# Patient Record
Sex: Female | Born: 1935 | Race: Black or African American | Hispanic: No | Smoking: Never smoker
Health system: Southern US, Community
[De-identification: ages and names within clinical notes are randomized; demographics above are authoritative.]

## PROBLEM LIST (undated history)

## (undated) DIAGNOSIS — R630 Anorexia: Secondary | ICD-10-CM

## (undated) DIAGNOSIS — E785 Hyperlipidemia, unspecified: Secondary | ICD-10-CM

## (undated) DIAGNOSIS — G250 Essential tremor: Secondary | ICD-10-CM

## (undated) DIAGNOSIS — K5090 Crohn's disease, unspecified, without complications: Secondary | ICD-10-CM

## (undated) DIAGNOSIS — G3109 Other frontotemporal dementia: Secondary | ICD-10-CM

## (undated) DIAGNOSIS — E559 Vitamin D deficiency, unspecified: Secondary | ICD-10-CM

## (undated) DIAGNOSIS — T783XXA Angioneurotic edema, initial encounter: Secondary | ICD-10-CM

## (undated) DIAGNOSIS — F0280 Dementia in other diseases classified elsewhere without behavioral disturbance: Secondary | ICD-10-CM

## (undated) DIAGNOSIS — E876 Hypokalemia: Secondary | ICD-10-CM

## (undated) DIAGNOSIS — I10 Essential (primary) hypertension: Secondary | ICD-10-CM

## (undated) DIAGNOSIS — N289 Disorder of kidney and ureter, unspecified: Secondary | ICD-10-CM

## (undated) DIAGNOSIS — D649 Anemia, unspecified: Secondary | ICD-10-CM

## (undated) DIAGNOSIS — T464X5A Adverse effect of angiotensin-converting-enzyme inhibitors, initial encounter: Secondary | ICD-10-CM

## (undated) DIAGNOSIS — M810 Age-related osteoporosis without current pathological fracture: Secondary | ICD-10-CM

## (undated) HISTORY — DX: Hyperlipidemia, unspecified: E78.5

## (undated) HISTORY — DX: Adverse effect of angiotensin-converting-enzyme inhibitors, initial encounter: T46.4X5A

## (undated) HISTORY — DX: Other frontotemporal dementia: G31.09

## (undated) HISTORY — DX: Essential (primary) hypertension: I10

## (undated) HISTORY — DX: Dementia in other diseases classified elsewhere, unspecified severity, without behavioral disturbance, psychotic disturbance, mood disturbance, and anxiety: F02.80

## (undated) HISTORY — DX: Vitamin D deficiency, unspecified: E55.9

## (undated) HISTORY — DX: Essential tremor: G25.0

## (undated) HISTORY — DX: Angioneurotic edema, initial encounter: T78.3XXA

## (undated) HISTORY — DX: Disorder of kidney and ureter, unspecified: N28.9

## (undated) HISTORY — DX: Anorexia: R63.0

## (undated) HISTORY — DX: Anemia, unspecified: D64.9

## (undated) HISTORY — DX: Crohn's disease, unspecified, without complications: K50.90

## (undated) HISTORY — DX: Hypokalemia: E87.6

## (undated) HISTORY — DX: Age-related osteoporosis without current pathological fracture: M81.0

---

## 2004-02-26 ENCOUNTER — Ambulatory Visit: Payer: Self-pay | Admitting: Gastroenterology

## 2006-06-15 DIAGNOSIS — I10 Essential (primary) hypertension: Secondary | ICD-10-CM

## 2006-06-15 DIAGNOSIS — E785 Hyperlipidemia, unspecified: Secondary | ICD-10-CM

## 2007-02-27 ENCOUNTER — Other Ambulatory Visit: Payer: Self-pay

## 2007-02-27 ENCOUNTER — Ambulatory Visit: Payer: Self-pay | Admitting: Ophthalmology

## 2007-03-04 ENCOUNTER — Ambulatory Visit: Payer: Self-pay | Admitting: Ophthalmology

## 2008-02-19 DIAGNOSIS — E2839 Other primary ovarian failure: Secondary | ICD-10-CM | POA: Insufficient documentation

## 2008-09-28 DIAGNOSIS — E559 Vitamin D deficiency, unspecified: Secondary | ICD-10-CM

## 2009-05-27 ENCOUNTER — Emergency Department: Payer: Self-pay

## 2009-06-28 DIAGNOSIS — F0280 Dementia in other diseases classified elsewhere without behavioral disturbance: Secondary | ICD-10-CM

## 2009-06-28 DIAGNOSIS — G3109 Other frontotemporal dementia: Secondary | ICD-10-CM

## 2009-09-07 HISTORY — PX: ABDOMINAL HYSTERECTOMY: SHX81

## 2013-03-11 ENCOUNTER — Ambulatory Visit: Payer: Self-pay | Admitting: Family Medicine

## 2013-03-11 LAB — HM DEXA SCAN: HM Dexa Scan: -3.9

## 2013-08-15 LAB — LIPID PANEL
Cholesterol: 223 mg/dL — AB (ref 0–200)
HDL: 66 mg/dL (ref 35–70)
LDL Cholesterol: 131 mg/dL
Triglycerides: 130 mg/dL (ref 40–160)

## 2014-06-09 ENCOUNTER — Other Ambulatory Visit: Payer: Self-pay | Admitting: Family Medicine

## 2014-06-19 ENCOUNTER — Other Ambulatory Visit: Payer: Self-pay | Admitting: Family Medicine

## 2014-06-19 NOTE — Telephone Encounter (Signed)
Patient requesting refill. 

## 2014-06-22 ENCOUNTER — Other Ambulatory Visit: Payer: Self-pay | Admitting: Family Medicine

## 2014-08-22 ENCOUNTER — Encounter: Payer: Self-pay | Admitting: Family Medicine

## 2014-08-22 DIAGNOSIS — T783XXA Angioneurotic edema, initial encounter: Secondary | ICD-10-CM

## 2014-08-22 DIAGNOSIS — K5010 Crohn's disease of large intestine without complications: Secondary | ICD-10-CM | POA: Insufficient documentation

## 2014-08-22 DIAGNOSIS — M810 Age-related osteoporosis without current pathological fracture: Secondary | ICD-10-CM | POA: Insufficient documentation

## 2014-08-22 DIAGNOSIS — Z862 Personal history of diseases of the blood and blood-forming organs and certain disorders involving the immune mechanism: Secondary | ICD-10-CM | POA: Insufficient documentation

## 2014-08-22 DIAGNOSIS — T464X5A Adverse effect of angiotensin-converting-enzyme inhibitors, initial encounter: Secondary | ICD-10-CM | POA: Insufficient documentation

## 2014-08-22 DIAGNOSIS — N183 Chronic kidney disease, stage 3 unspecified: Secondary | ICD-10-CM | POA: Insufficient documentation

## 2014-08-22 DIAGNOSIS — G250 Essential tremor: Secondary | ICD-10-CM | POA: Insufficient documentation

## 2014-08-25 ENCOUNTER — Encounter: Payer: Self-pay | Admitting: Family Medicine

## 2014-08-25 ENCOUNTER — Ambulatory Visit (INDEPENDENT_AMBULATORY_CARE_PROVIDER_SITE_OTHER): Payer: Medicare Other | Admitting: Family Medicine

## 2014-08-25 VITALS — BP 138/86 | HR 62 | Temp 97.8°F | Resp 16 | Ht 68.0 in | Wt 157.9 lb

## 2014-08-25 DIAGNOSIS — Z23 Encounter for immunization: Secondary | ICD-10-CM

## 2014-08-25 DIAGNOSIS — R630 Anorexia: Secondary | ICD-10-CM | POA: Insufficient documentation

## 2014-08-25 DIAGNOSIS — F0280 Dementia in other diseases classified elsewhere without behavioral disturbance: Secondary | ICD-10-CM

## 2014-08-25 DIAGNOSIS — I10 Essential (primary) hypertension: Secondary | ICD-10-CM | POA: Diagnosis not present

## 2014-08-25 DIAGNOSIS — E785 Hyperlipidemia, unspecified: Secondary | ICD-10-CM

## 2014-08-25 DIAGNOSIS — H9193 Unspecified hearing loss, bilateral: Secondary | ICD-10-CM | POA: Diagnosis not present

## 2014-08-25 DIAGNOSIS — G3109 Other frontotemporal dementia: Secondary | ICD-10-CM | POA: Diagnosis not present

## 2014-08-25 DIAGNOSIS — E559 Vitamin D deficiency, unspecified: Secondary | ICD-10-CM | POA: Diagnosis not present

## 2014-08-25 DIAGNOSIS — D509 Iron deficiency anemia, unspecified: Secondary | ICD-10-CM

## 2014-08-25 DIAGNOSIS — K5010 Crohn's disease of large intestine without complications: Secondary | ICD-10-CM

## 2014-08-25 DIAGNOSIS — N184 Chronic kidney disease, stage 4 (severe): Secondary | ICD-10-CM | POA: Diagnosis not present

## 2014-08-25 DIAGNOSIS — H9190 Unspecified hearing loss, unspecified ear: Secondary | ICD-10-CM | POA: Insufficient documentation

## 2014-08-25 MED ORDER — ATENOLOL-CHLORTHALIDONE 50-25 MG PO TABS: 0.5000 | ORAL_TABLET | Freq: Two times a day (BID) | ORAL | Status: DC

## 2014-08-25 MED ORDER — MIRTAZAPINE 15 MG PO TABS: 15.0000 mg | ORAL_TABLET | Freq: Every day | ORAL | Status: DC

## 2014-08-25 MED ORDER — ATORVASTATIN CALCIUM 40 MG PO TABS: 40.0000 mg | ORAL_TABLET | Freq: Every day | ORAL | Status: DC

## 2014-08-25 MED ORDER — DONEPEZIL HCL 5 MG PO TABS: 5.0000 mg | ORAL_TABLET | Freq: Every day | ORAL | Status: DC

## 2014-08-25 MED ORDER — AMLODIPINE BESYLATE 10 MG PO TABS: 10.0000 mg | ORAL_TABLET | Freq: Every day | ORAL | Status: DC

## 2014-08-25 NOTE — Progress Notes (Signed)
Name: Meredith Wagner   MRN: [id number]    DOB: Nov 17, 1917   Date:08/25/2014       Progress Note  Subjective  Chief Complaint  Chief Complaint  Patient presents with  . Medication Refill    6 month F/U  . Hypertension    Well Controlled  . Memory Loss    Doing stable on medication  . Hyperlipidemia    No problems with medication  . Weight Loss    On appetite, constantly eating snacks and cookies    HPI  HTN: doing well, bp is at goal, no dizziness, denies side effects of medication  Hyperlipidemia: taking Atorvastatin and denies myalgias  Frontotemporal dementia: she is stable, appetite is better with Remeron, lives with her daughter, normal behavior, cooperative, still able to do ADL on her own. Daughter dispenses her medication  CKI: time to recheck labs, denies pruritus, or edema.   Crohn's disease: doing well, going to see Dr. Hassell Done this Friday, no recent abdominal pain or rectal bleeding, taking medication as prescribed  Patient Active Problem List   Diagnosis Date Noted  . Lack of appetite 08/25/2014  . 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 Chronic kidney disease (CKD), stage IV (severe) 08/22/2014  . Crohn's disease of large bowel 08/22/2014  . Hereditary essential tremor 08/22/2014  . Anemia, iron deficiency 08/22/2014  . OP (osteoporosis) 08/22/2014  . Dementia, frontotemporal 06/28/2009  . Vitamin D deficiency 09/28/2008  . 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 Benign essential HTN 06/15/2006  . Dyslipidemia 06/15/2006    Past Surgical History  Procedure Laterality Date  . Abdominal hysterectomy  09/07/2009    History reviewed. No pertinent family history.  Social History   Social History  . Marital Status: Legally Separated    Spouse Name: N/A  . Number of Children: N/A  . Years of Education: N/A   Occupational History  . Not on file.   Social History Main Topics  . Smoking status: Never Smoker   . Smokeless tobacco: Never Used  .  Alcohol Use: No  . Drug Use: No  . Sexual Activity: Not Currently   Other Topics Concern  . Not on file   Social History Narrative     Current outpatient prescriptions:  .  amLODipine (NORVASC) 10 MG tablet, Take 1 tablet (10 mg total) by mouth daily., Disp: 90 tablet, Rfl: 1 .  atenolol-chlorthalidone (TENORETIC) 50-25 MG per tablet, Take 0.5 tablets by mouth 2 (two) times daily., Disp: 45 tablet, Rfl: 1 .  atorvastatin (LIPITOR) 40 MG tablet, Take 1 tablet (40 mg total) by mouth daily at 6 PM., Disp: 90 tablet, Rfl: 1 .  donepezil (ARICEPT) 5 MG tablet, Take 1 tablet (5 mg total) by mouth daily., Disp: 90 tablet, Rfl: 1 .  Mesalamine (ASACOL HD) 800 MG TBEC, Take 1 tablet by mouth 3 (three) times daily., Disp: , Rfl:  .  RA VITAMIN D-3 2000 UNITS CAPS, take 1 capsule by mouth once daily, Disp: 30 capsule, Rfl: 12 .  mirtazapine (REMERON) 15 MG tablet, Take 1 tablet (15 mg total) by mouth at bedtime., Disp: 90 tablet, Rfl: 1  Allergies  Allergen Reactions  . Ace Inhibitors     Other reaction(s): Angioedema  . Fluzone  [Flu Virus Vaccine]   . Niacin Er   . Penicillins      ROS  Constitutional: Negative for fever or weight change.  Respiratory: Negative for cough and shortness of breath.   Cardiovascular: Negative for chest pain or palpitations.  Gastrointestinal: Negative for abdominal pain, no bowel changes.  Musculoskeletal: Negative for gait problem or joint swelling.  Skin: Negative for rash.  Neurological: Negative for dizziness or headache.  No other specific complaints in a complete review of systems (except as listed in HPI above).  Objective  Filed Vitals:   08/25/14 0823  BP: 138/86  Pulse: 62  Temp: 97.8 F (36.6 C)  TempSrc: Oral  Resp: 16  Height: 5' 8"  (1.727 m)  Weight: 157 lb 14.4 oz (71.623 kg)  SpO2: 95%    Body mass index is 24.01 kg/(m^2).  Physical Exam  Constitutional: Patient appears well-developed and well-nourished.  No distress.   HEENT: head atraumatic, normocephalic, pupils equal and reactive to light,  neck supple, throat within normal limits Cardiovascular: Normal rate, regular rhythm and normal heart sounds.  No murmur heard. No BLE edema. Pulmonary/Chest: Effort normal and breath sounds normal. No respiratory distress. Abdominal: Soft.  There is no tenderness. Psychiatric: Patient has a normal mood and affect. behavior is normal. Neuro: hand tremors    PHQ2/9: Depression screen PHQ 2/9 08/25/2014  Decreased Interest 0  Down, Depressed, Hopeless 0  PHQ - 2 Score 0    Fall Risk: Fall Risk  08/25/2014  Falls in the past year? No      Assessment & Plan  1. Benign essential HTN At goal - Comprehensive metabolic panel - atenolol-chlorthalidone (TENORETIC) 50-25 MG per tablet; Take 0.5 tablets by mouth 2 (two) times daily.  Dispense: 45 tablet; Refill: 1 - amLODipine (NORVASC) 10 MG tablet; Take 1 tablet (10 mg total) by mouth daily.  Dispense: 90 tablet; Refill: 1  2. Chronic kidney disease (CKD), stage IV (severe)  - Comprehensive metabolic panel  3. Anemia, iron deficiency  - Ferritin - CBC with Differential/Platelet - Iron  4. Dyslipidemia  - Lipid panel - atorvastatin (LIPITOR) 40 MG tablet; Take 1 tablet (40 mg total) by mouth daily at 6 PM.  Dispense: 90 tablet; Refill: 1  5. Vitamin D deficiency  - Vit D  25 hydroxy (rtn osteoporosis monitoring)  6. Crohn's disease of large bowel, without complications  - Ferritin - CBC with Differential/Platelet - C-reactive protein  7. Needs flu shot  - Flu Vaccine QUAD 36+ mos IM  8. Dementia, frontotemporal  - donepezil (ARICEPT) 5 MG tablet; Take 1 tablet (5 mg total) by mouth daily.  Dispense: 90 tablet; Refill: 1  9. Lack of appetite  - mirtazapine (REMERON) 15 MG tablet; Take 1 tablet (15 mg total) by mouth at bedtime.  Dispense: 90 tablet; Refill: 1

## 2014-08-26 LAB — LIPID PANEL
CHOL/HDL RATIO: 2.9 ratio (ref 0.0–4.4)
CHOLESTEROL TOTAL: 172 mg/dL (ref 100–199)
HDL: 59 mg/dL (ref >39)
LDL CALC: 88 mg/dL (ref 0–99)
TRIGLYCERIDES: 124 mg/dL (ref 0–149)
VLDL CHOLESTEROL CAL: 25 mg/dL (ref 5–40)

## 2014-08-26 LAB — COMPREHENSIVE METABOLIC PANEL
ALBUMIN: 4 g/dL (ref 3.2–4.6)
ALK PHOS: 95 IU/L (ref 39–117)
ALT: 11 IU/L (ref 0–32)
AST: 15 IU/L (ref 0–40)
Albumin/Globulin Ratio: 1.2 (ref 1.1–2.5)
BUN / CREAT RATIO: 17 (ref 11–26)
BUN: 20 mg/dL (ref 10–36)
Bilirubin Total: 0.3 mg/dL (ref 0.0–1.2)
CO2: 26 mmol/L (ref 18–29)
CREATININE: 1.16 mg/dL — AB (ref 0.57–1.00)
Calcium: 9.4 mg/dL (ref 8.7–10.3)
Chloride: 103 mmol/L (ref 97–108)
GFR calc non Af Amer: 40 mL/min/{1.73_m2} — ABNORMAL LOW (ref >59)
GFR, EST AFRICAN AMERICAN: 47 mL/min/{1.73_m2} — AB (ref >59)
GLUCOSE: 101 mg/dL — AB (ref 65–99)
Globulin, Total: 3.3 g/dL (ref 1.5–4.5)
Potassium: 3.4 mmol/L — ABNORMAL LOW (ref 3.5–5.2)
Sodium: 145 mmol/L — ABNORMAL HIGH (ref 134–144)
TOTAL PROTEIN: 7.3 g/dL (ref 6.0–8.5)

## 2014-08-26 LAB — CBC WITH DIFFERENTIAL/PLATELET
Basophils Absolute: 0 10*3/uL (ref 0.0–0.2)
Basos: 1 %
EOS (ABSOLUTE): 0.2 10*3/uL (ref 0.0–0.4)
EOS: 2 %
HEMATOCRIT: 39.2 % (ref 34.0–46.6)
HEMOGLOBIN: 13.1 g/dL (ref 11.1–15.9)
IMMATURE GRANS (ABS): 0 10*3/uL (ref 0.0–0.1)
Immature Granulocytes: 0 %
LYMPHS ABS: 2.7 10*3/uL (ref 0.7–3.1)
LYMPHS: 33 %
MCH: 29.2 pg (ref 26.6–33.0)
MCHC: 33.4 g/dL (ref 31.5–35.7)
MCV: 88 fL (ref 79–97)
MONOCYTES: 7 %
Monocytes Absolute: 0.6 10*3/uL (ref 0.1–0.9)
Neutrophils Absolute: 4.8 10*3/uL (ref 1.4–7.0)
Neutrophils: 57 %
Platelets: 290 10*3/uL (ref 150–379)
RBC: 4.48 x10E6/uL (ref 3.77–5.28)
RDW: 14.1 % (ref 12.3–15.4)
WBC: 8.3 10*3/uL (ref 3.4–10.8)

## 2014-08-26 LAB — FERRITIN: FERRITIN: 167 ng/mL — AB (ref 15–150)

## 2014-08-26 LAB — IRON: Iron: 68 ug/dL (ref 27–139)

## 2014-08-26 LAB — C-REACTIVE PROTEIN: CRP: 5.6 mg/L — ABNORMAL HIGH (ref 0.0–4.9)

## 2014-08-26 LAB — VITAMIN D 25 HYDROXY (VIT D DEFICIENCY, FRACTURES): Vit D, 25-Hydroxy: 57.6 ng/mL (ref 30.0–100.0)

## 2014-08-26 NOTE — Progress Notes (Signed)
Patient daughter notified

## 2014-11-30 ENCOUNTER — Other Ambulatory Visit: Payer: Self-pay | Admitting: Family Medicine

## 2014-12-30 ENCOUNTER — Other Ambulatory Visit: Payer: Self-pay | Admitting: Family Medicine

## 2014-12-30 NOTE — Telephone Encounter (Signed)
Patient requesting refill. 

## 2015-01-29 ENCOUNTER — Other Ambulatory Visit: Payer: Self-pay | Admitting: Family Medicine

## 2015-01-29 NOTE — Telephone Encounter (Signed)
Patient requesting refill. 

## 2015-02-26 ENCOUNTER — Ambulatory Visit: Payer: Medicare Other | Admitting: Family Medicine

## 2015-02-28 ENCOUNTER — Other Ambulatory Visit: Payer: Self-pay | Admitting: Family Medicine

## 2015-03-01 NOTE — Telephone Encounter (Signed)
Patient has appointment for 03-02-15

## 2015-03-02 ENCOUNTER — Other Ambulatory Visit: Payer: Self-pay | Admitting: Family Medicine

## 2015-03-02 ENCOUNTER — Ambulatory Visit (INDEPENDENT_AMBULATORY_CARE_PROVIDER_SITE_OTHER): Payer: Medicare Other | Admitting: Family Medicine

## 2015-03-02 ENCOUNTER — Encounter: Payer: Self-pay | Admitting: Family Medicine

## 2015-03-02 VITALS — BP 128/82 | HR 68 | Temp 97.9°F | Resp 12 | Wt 156.1 lb

## 2015-03-02 DIAGNOSIS — N184 Chronic kidney disease, stage 4 (severe): Secondary | ICD-10-CM

## 2015-03-02 DIAGNOSIS — R630 Anorexia: Secondary | ICD-10-CM | POA: Diagnosis not present

## 2015-03-02 DIAGNOSIS — E785 Hyperlipidemia, unspecified: Secondary | ICD-10-CM | POA: Diagnosis not present

## 2015-03-02 DIAGNOSIS — I10 Essential (primary) hypertension: Secondary | ICD-10-CM | POA: Diagnosis not present

## 2015-03-02 DIAGNOSIS — G3109 Other frontotemporal dementia: Secondary | ICD-10-CM | POA: Diagnosis not present

## 2015-03-02 DIAGNOSIS — F0280 Dementia in other diseases classified elsewhere without behavioral disturbance: Secondary | ICD-10-CM | POA: Diagnosis not present

## 2015-03-02 DIAGNOSIS — K5010 Crohn's disease of large intestine without complications: Secondary | ICD-10-CM

## 2015-03-02 MED ORDER — MIRTAZAPINE 15 MG PO TABS: 15.0000 mg | ORAL_TABLET | Freq: Every day | ORAL | Status: DC

## 2015-03-02 NOTE — Progress Notes (Signed)
Name: Meredith Wagner   MRN: [id number]    DOB: 07-Mar-1917   Date:03/02/2015       Progress Note  Subjective  Chief Complaint  Chief Complaint  Patient presents with  . Medication Refill    4 month F/U  . Hypertension  . Hyperlipidemia  . Memory Loss    HPI  HTN: doing well, bp is at goal, no dizziness, no chest pain or palpitation, denies side effects of medication  Hyperlipidemia: taking Atorvastatin and denies myalgias, last labs at goal.   Frontotemporal dementia: she is stable, appetite is better with Remeron, lives with her daughter, normal behavior, cooperative, still able to do ADL on her own. Daughter dispenses her medication          CKI: time to recheck labs, denies pruritus, or edema.   Crohn's disease: doing well, going to see Dr. Hassell Done about 6 months, no recent abdominal pain or rectal bleeding, taking medication as prescribed   Patient Active Problem List   Diagnosis Date Noted  . Lack of appetite 08/25/2014  . Hearing loss 08/25/2014  . 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 Chronic kidney disease (CKD), stage IV (severe) ([place]) 08/22/2014  . Crohn's disease of large bowel ([place]) 08/22/2014  . Hereditary essential tremor 08/22/2014  . Anemia, iron deficiency 08/22/2014  . OP (osteoporosis) 08/22/2014  . Dementia, frontotemporal 06/28/2009  . Vitamin D deficiency 09/28/2008  . 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 Benign essential HTN 06/15/2006  . Dyslipidemia 06/15/2006    Past Surgical History  Procedure Laterality Date  . Abdominal hysterectomy  09/07/2009    History reviewed. No pertinent family history.  Social History   Social History  . Marital Status: Legally Separated    Spouse Name: N/A  . Number of Children: N/A  . Years of Education: N/A   Occupational History  . Not on file.   Social History Main Topics  . Smoking status: Never Smoker   . Smokeless tobacco: Never Used  . Alcohol Use: No  . Drug Use: No  . Sexual  Activity: Not Currently   Other Topics Concern  . Not on file   Social History Narrative     Current outpatient prescriptions:  .  amLODipine (NORVASC) 10 MG tablet, take 1 tablet by mouth once daily, Disp: 90 tablet, Rfl: 1 .  atenolol-chlorthalidone (TENORETIC) 50-25 MG tablet, take 1/2 tablet by mouth twice a day, Disp: 45 tablet, Rfl: 1 .  atorvastatin (LIPITOR) 40 MG tablet, take 1 tablet by mouth once daily AT 6 PM, Disp: 90 tablet, Rfl: 1 .  donepezil (ARICEPT) 5 MG tablet, take 1 tablet by mouth once daily, Disp: 90 tablet, Rfl: 1 .  Mesalamine (ASACOL HD) 800 MG TBEC, Take 1 tablet by mouth 3 (three) times daily., Disp: , Rfl:  .  mirtazapine (REMERON) 15 MG tablet, Take 1 tablet (15 mg total) by mouth at bedtime., Disp: 90 tablet, Rfl: 1 .  RA VITAMIN D-3 2000 UNITS CAPS, take 1 capsule by mouth once daily, Disp: 30 capsule, Rfl: 12  Allergies  Allergen Reactions  . 2,4-D Dimethylamine (Amisol) Swelling  . Ace Inhibitors     Other reaction(s): Angioedema  . Niacin Er   . Penicillins      ROS  Constitutional: Negative for fever or significant weight change.  Respiratory: Negative for cough and shortness of breath.   Cardiovascular: Negative for chest pain or palpitations.  Gastrointestinal: Negative for abdominal pain, no bowel changes.  Musculoskeletal: Negative for gait problem  or joint swelling.  Skin: Negative for rash.  Neurological: Negative for dizziness or headache.  No other specific complaints in a complete review of systems (except as listed in HPI above).  Objective  Filed Vitals:   03/02/15 1005  BP: 128/82  Pulse: 68  Temp: 97.9 F (36.6 C)  TempSrc: Oral  Resp: 12  Weight: 156 lb 1.6 oz (70.806 kg)  SpO2: 96%    Body mass index is 23.74 kg/(m^2).  Physical Exam  Constitutional: Patient appears well-developed and well-nourished. No distress.  HEENT: head atraumatic, normocephalic, pupils equal and reactive to light, neck supple, throat  within normal limits Cardiovascular: Normal rate, regular rhythm and normal heart sounds.  No murmur heard. No BLE edema. Pulmonary/Chest: Effort normal and breath sounds normal. No respiratory distress. Abdominal: Soft.  There is no tenderness. Psychiatric: Patient has a normal mood and affect. behavior is normal. J   PHQ2/9: Depression screen [facility] 2/9 03/02/2015 08/25/2014  Decreased Interest 0 0  Down, Depressed, Hopeless 0 0  PHQ - 2 Score 0 0    Fall Risk: Fall Risk  03/02/2015 08/25/2014  Falls in the past year? No No    Functional Status Survey: Is the patient deaf or have difficulty hearing?: Yes Does the patient have difficulty seeing, even when wearing glasses/contacts?: No Does the patient have difficulty concentrating, remembering, or making decisions?: No Does the patient have difficulty walking or climbing stairs?: Yes Does the patient have difficulty dressing or bathing?: No Does the patient have difficulty doing errands alone such as visiting a doctor's office or shopping?: No   Assessment & Plan  1. Benign essential HTN  Doing well, no side effects of medication.  2. Chronic kidney disease (CKD), stage IV (severe) (HCC)  -GFR  3. Dyslipidemia  Doing well on medication  4. Crohn's disease of large bowel, without complications  Doing well  5. Lack of appetite  - mirtazapine (REMERON) 15 MG tablet; Take 1 tablet (15 mg total) by mouth at bedtime.  Dispense: 90 tablet; Refill: 1  6. Dementia, frontotemporal  stable

## 2015-03-03 ENCOUNTER — Other Ambulatory Visit: Payer: Self-pay | Admitting: Family Medicine

## 2015-03-03 DIAGNOSIS — I10 Essential (primary) hypertension: Secondary | ICD-10-CM

## 2015-03-03 DIAGNOSIS — Z79899 Other long term (current) drug therapy: Secondary | ICD-10-CM

## 2015-03-03 DIAGNOSIS — N184 Chronic kidney disease, stage 4 (severe): Secondary | ICD-10-CM

## 2015-03-03 LAB — GLOM FILT RATE, ESTIMATED
Creatinine, Ser: 1.26 mg/dL — ABNORMAL HIGH (ref 0.57–1.00)
GFR, EST AFRICAN AMERICAN: 42 mL/min/{1.73_m2} — AB (ref >59)
GFR, EST NON AFRICAN AMERICAN: 36 mL/min/{1.73_m2} — AB (ref >59)

## 2015-03-30 ENCOUNTER — Other Ambulatory Visit: Payer: Self-pay | Admitting: Family Medicine

## 2015-04-10 ENCOUNTER — Other Ambulatory Visit: Payer: Self-pay | Admitting: Family Medicine

## 2015-04-12 NOTE — Telephone Encounter (Signed)
Patient requesting refill. 

## 2015-05-29 ENCOUNTER — Other Ambulatory Visit: Payer: Self-pay | Admitting: Family Medicine

## 2015-05-30 ENCOUNTER — Other Ambulatory Visit: Payer: Self-pay | Admitting: Family Medicine

## 2015-06-28 ENCOUNTER — Other Ambulatory Visit: Payer: Self-pay | Admitting: Family Medicine

## 2015-08-27 ENCOUNTER — Other Ambulatory Visit: Payer: Self-pay | Admitting: Family Medicine

## 2015-08-27 NOTE — Telephone Encounter (Signed)
Patient requesting refill of Amlodipine, Atorvastatin, Aricept be sent to [facility].

## 2015-08-30 ENCOUNTER — Ambulatory Visit (INDEPENDENT_AMBULATORY_CARE_PROVIDER_SITE_OTHER): Payer: Medicare Other | Admitting: Family Medicine

## 2015-08-30 ENCOUNTER — Ambulatory Visit: Payer: Medicare Other | Admitting: Family Medicine

## 2015-08-30 ENCOUNTER — Encounter: Payer: Self-pay | Admitting: Family Medicine

## 2015-08-30 VITALS — BP 132/84 | HR 63 | Temp 98.0°F | Resp 18 | Ht 68.0 in | Wt 150.4 lb

## 2015-08-30 DIAGNOSIS — F0280 Dementia in other diseases classified elsewhere without behavioral disturbance: Secondary | ICD-10-CM | POA: Diagnosis not present

## 2015-08-30 DIAGNOSIS — K5010 Crohn's disease of large intestine without complications: Secondary | ICD-10-CM

## 2015-08-30 DIAGNOSIS — H9193 Unspecified hearing loss, bilateral: Secondary | ICD-10-CM | POA: Diagnosis not present

## 2015-08-30 DIAGNOSIS — R630 Anorexia: Secondary | ICD-10-CM | POA: Diagnosis not present

## 2015-08-30 DIAGNOSIS — E785 Hyperlipidemia, unspecified: Secondary | ICD-10-CM

## 2015-08-30 DIAGNOSIS — I10 Essential (primary) hypertension: Secondary | ICD-10-CM | POA: Diagnosis not present

## 2015-08-30 DIAGNOSIS — E559 Vitamin D deficiency, unspecified: Secondary | ICD-10-CM | POA: Diagnosis not present

## 2015-08-30 DIAGNOSIS — G3109 Other frontotemporal dementia: Secondary | ICD-10-CM

## 2015-08-30 DIAGNOSIS — N184 Chronic kidney disease, stage 4 (severe): Secondary | ICD-10-CM

## 2015-08-30 LAB — CBC WITH DIFFERENTIAL/PLATELET
BASOS PCT: 1 %
Basophils Absolute: 101 cells/uL (ref 0–200)
EOS ABS: 202 {cells}/uL (ref 15–500)
Eosinophils Relative: 2 %
HEMATOCRIT: 39.9 % (ref 35.0–45.0)
Hemoglobin: 13.4 g/dL (ref 11.7–15.5)
LYMPHS PCT: 45 %
Lymphs Abs: 4545 cells/uL — ABNORMAL HIGH (ref 850–3900)
MCH: 29.5 pg (ref 27.0–33.0)
MCHC: 33.6 g/dL (ref 32.0–36.0)
MCV: 87.9 fL (ref 80.0–100.0)
MONO ABS: 909 {cells}/uL (ref 200–950)
MPV: 11.3 fL (ref 7.5–12.5)
Monocytes Relative: 9 %
NEUTROS ABS: 4343 {cells}/uL (ref 1500–7800)
Neutrophils Relative %: 43 %
PLATELETS: 316 10*3/uL (ref 140–400)
RBC: 4.54 MIL/uL (ref 3.80–5.10)
RDW: 14.4 % (ref 11.0–15.0)
WBC: 10.1 10*3/uL (ref 3.8–10.8)

## 2015-08-30 LAB — LIPID PANEL
CHOL/HDL RATIO: 2.6 ratio (ref <=5.0)
CHOLESTEROL: 182 mg/dL (ref 125–200)
HDL: 71 mg/dL (ref >=46)
LDL Cholesterol: 89 mg/dL (ref <130)
Triglycerides: 112 mg/dL (ref <150)
VLDL: 22 mg/dL (ref <30)

## 2015-08-30 LAB — COMPLETE METABOLIC PANEL WITH GFR
ALBUMIN: 4 g/dL (ref 3.6–5.1)
ALK PHOS: 92 U/L (ref 33–130)
ALT: 10 U/L (ref 6–29)
AST: 17 U/L (ref 10–35)
BILIRUBIN TOTAL: 0.5 mg/dL (ref 0.2–1.2)
BUN: 21 mg/dL (ref 7–25)
CALCIUM: 9.6 mg/dL (ref 8.6–10.4)
CO2: 25 mmol/L (ref 20–31)
CREATININE: 1.33 mg/dL — AB (ref 0.60–0.88)
Chloride: 104 mmol/L (ref 98–110)
GFR, Est African American: 39 mL/min — ABNORMAL LOW (ref >=60)
GFR, Est Non African American: 34 mL/min — ABNORMAL LOW (ref >=60)
GLUCOSE: 121 mg/dL — AB (ref 65–99)
POTASSIUM: 3.5 mmol/L (ref 3.5–5.3)
SODIUM: 143 mmol/L (ref 135–146)
TOTAL PROTEIN: 7.7 g/dL (ref 6.1–8.1)

## 2015-08-30 MED ORDER — DONEPEZIL HCL 10 MG PO TABS: 10.0000 mg | ORAL_TABLET | Freq: Every day | ORAL | 1 refills | Status: DC

## 2015-08-30 MED ORDER — MIRTAZAPINE 15 MG PO TABS: 15.0000 mg | ORAL_TABLET | Freq: Every day | ORAL | 1 refills | Status: DC

## 2015-08-30 NOTE — Progress Notes (Signed)
Name: Meredith Wagner   MRN: [id number]    DOB: [birth year]   Date:08/30/2015       Progress Note  Subjective  Chief Complaint  Chief Complaint  Patient presents with  . Medication Refill    6 month F/U  . Hypertension  . Hyperlipidemia  . Memory Loss    HPI  HTN: doing well, bp is at goal, no dizziness, no chest pain or palpitation, denies side effects of medication. BP is not getting checked at home  Hyperlipidemia: taking Atorvastatin and denies myalgias, last labs at goal. Due for repeat labs today   Frontotemporal dementia: she is stable, appetite is better with Remeron, but she has lost almost 6 lbs since last visit, lives with her daughter, normal behavior, cooperative, she is able to shower, eat, prepares breakfast. Daughter dispenses her medication and takes care of the house. Daughter feels like since she lost her grand-daughter she is feeling more sad, she was living with them for 6 months before she died.         CKI: time to recheck labs, denies pruritus, or edema. She has normal urine output  Crohn's disease: doing well, going to see Dr. Hassell Done every  6 months, no recent abdominal pain, diarrhea  or rectal bleeding, taking medication as prescribed - Asacol .   Patient Active Problem List   Diagnosis Date Noted  . Lack of appetite 08/25/2014  . Hearing loss 08/25/2014  . 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 Chronic kidney disease (CKD), stage IV (severe) ([place]) 08/22/2014  . Crohn's disease of large bowel ([place]) 08/22/2014  . Hereditary essential tremor 08/22/2014  . History of iron deficiency anemia 08/22/2014  . OP (osteoporosis) 08/22/2014  . Dementia, frontotemporal 06/28/2009  . Vitamin D deficiency 09/28/2008  . 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 Benign essential HTN 06/15/2006  . Dyslipidemia 06/15/2006    Past Surgical History:  Procedure Laterality Date  . ABDOMINAL HYSTERECTOMY  09/07/2009    No family history on file.  Social History    Social History  . Marital status: Legally Separated    Spouse name: N/A  . Number of children: N/A  . Years of education: N/A   Occupational History  . Not on file.   Social History Main Topics  . Smoking status: Never Smoker  . Smokeless tobacco: Never Used  . Alcohol use No  . Drug use: No  . Sexual activity: Not Currently   Other Topics Concern  . Not on file   Social History Narrative  . No narrative on file     Current Outpatient Prescriptions:  .  amLODipine (NORVASC) 10 MG tablet, take 1 tablet by mouth once daily, Disp: 90 tablet, Rfl: 1 .  atenolol-chlorthalidone (TENORETIC) 50-25 MG tablet, take 1/2 tablet by mouth twice a day, Disp: 45 tablet, Rfl: 1 .  atorvastatin (LIPITOR) 40 MG tablet, take 1 tablet by mouth once daily AT 6PM, Disp: 90 tablet, Rfl: 1 .  donepezil (ARICEPT) 5 MG tablet, take 1 tablet by mouth once daily, Disp: 90 tablet, Rfl: 1 .  Mesalamine (ASACOL HD) 800 MG TBEC, Take 1 tablet by mouth 3 (three) times daily., Disp: , Rfl:  .  mirtazapine (REMERON) 15 MG tablet, Take 1 tablet (15 mg total) by mouth at bedtime., Disp: 90 tablet, Rfl: 1 .  RA VITAMIN D-3 2000 units CAPS, take 1 capsule by mouth once daily, Disp: 30 capsule, Rfl: 12  Allergies  Allergen Reactions  . 2,4-D Dimethylamine (Amisol) Swelling  .  Ace Inhibitors     Other reaction(s): Angioedema  . Niacin Er   . Penicillins      ROS  Constitutional: Negative for fever, positive for  weight change.  Respiratory: Negative for cough and shortness of breath.   Cardiovascular: Negative for chest pain or palpitations.  Gastrointestinal: Negative for abdominal pain, no bowel changes.  Musculoskeletal: Negative for gait problem or joint swelling.  Skin: Negative for rash.  Neurological: Negative for dizziness or headache.  No other specific complaints in a complete review of systems (except as listed in HPI above).  Objective  Vitals:   08/30/15 0841  BP: 132/84  Pulse: 63   Resp: 18  Temp: 98 F (36.7 C)  TempSrc: Oral  SpO2: 97%  Weight: 150 lb 6.4 oz (68.2 kg)  Height: 5' 8"  (1.727 m)    Body mass index is 22.87 kg/m.  Physical Exam  Constitutional: Patient appears well-developed and well-nourished.  No distress.  HEENT: head atraumatic, normocephalic, pupils equal and reactive to light, neck supple, throat within normal limits Cardiovascular: Normal rate, regular rhythm and normal heart sounds.  No murmur heard. No BLE edema. Pulmonary/Chest: Effort normal and breath sounds normal. No respiratory distress. Abdominal: Soft.  There is no tenderness. Psychiatric: Patient has a normal mood and affect. behavior is normal. Judgment and thought content normal.  PHQ2/9: Depression screen [facility] 2/9 03/02/2015 08/25/2014  Decreased Interest 0 0  Down, Depressed, Hopeless 0 0  PHQ - 2 Score 0 0     Fall Risk: Fall Risk  03/02/2015 08/25/2014  Falls in the past year? No No     Assessment & Plan  1. Benign essential HTN  - COMPLETE METABOLIC PANEL WITH GFR - CBC with Differential/Platelet  2. Chronic kidney disease (CKD), stage IV (severe) (HCC)  - CBC with Differential/Platelet  3. Dyslipidemia  - Lipid panel  4. Crohn's disease of large bowel, without complications  - CBC with Differential/Platelet  5. Lack of appetite  - mirtazapine (REMERON) 15 MG tablet; Take 1 tablet (15 mg total) by mouth at bedtime.  Dispense: 90 tablet; Refill: 1  6. Dementia, frontotemporal  - donepezil (ARICEPT) 10 MG tablet; Take 1 tablet (10 mg total) by mouth daily.  Dispense: 90 tablet; Refill: 1  7. Vitamin D deficiency  - VITAMIN D 25 Hydroxy (Vit-D Deficiency, Fractures)  8. Hearing loss, bilateral  stable

## 2015-08-31 LAB — VITAMIN D 25 HYDROXY (VIT D DEFICIENCY, FRACTURES): Vit D, 25-Hydroxy: 72 ng/mL (ref 30–100)

## 2015-09-01 ENCOUNTER — Encounter: Payer: Self-pay | Admitting: Family Medicine

## 2015-09-02 ENCOUNTER — Other Ambulatory Visit: Payer: Self-pay | Admitting: Family Medicine

## 2015-09-02 ENCOUNTER — Other Ambulatory Visit: Payer: Self-pay

## 2015-09-02 DIAGNOSIS — R739 Hyperglycemia, unspecified: Secondary | ICD-10-CM

## 2015-09-03 LAB — HEMOGLOBIN A1C
Hgb A1c MFr Bld: 5.4 % (ref <5.7)
Mean Plasma Glucose: 108 mg/dL

## 2015-12-25 ENCOUNTER — Other Ambulatory Visit: Payer: Self-pay | Admitting: Family Medicine

## 2015-12-29 ENCOUNTER — Other Ambulatory Visit: Payer: Self-pay | Admitting: Family Medicine

## 2015-12-29 NOTE — Telephone Encounter (Signed)
Patient requesting refill of Atenolol-Chlorthalidone to [facility].

## 2016-01-05 ENCOUNTER — Telehealth: Payer: Self-pay | Admitting: Family Medicine

## 2016-01-05 NOTE — Telephone Encounter (Signed)
Patient has appointment scheduled for March for her 73monthfollow up. She only have 2 pills left of the atenolol. Please send to [facility]. Patient thought when she came last year that you gave her enough refills to last until her appointment but states you did not. Please call DBritt Boozer(daughter) [phone]

## 2016-01-06 ENCOUNTER — Other Ambulatory Visit: Payer: Self-pay | Admitting: Family Medicine

## 2016-01-06 MED ORDER — ATENOLOL-CHLORTHALIDONE 50-25 MG PO TABS: 0.5000 | ORAL_TABLET | Freq: Two times a day (BID) | ORAL | 1 refills | Status: DC

## 2016-01-06 NOTE — Telephone Encounter (Signed)
Sent prescription

## 2016-01-06 NOTE — Telephone Encounter (Signed)
Delores (daugher) informed.

## 2016-01-24 ENCOUNTER — Other Ambulatory Visit: Payer: Self-pay | Admitting: Family Medicine

## 2016-01-24 NOTE — Telephone Encounter (Signed)
Patient requesting refill of Amlodipine and Atorvastatin to [facility].

## 2016-02-21 ENCOUNTER — Other Ambulatory Visit: Payer: Self-pay | Admitting: Family Medicine

## 2016-03-02 ENCOUNTER — Encounter: Payer: Self-pay | Admitting: Family Medicine

## 2016-03-02 ENCOUNTER — Ambulatory Visit (INDEPENDENT_AMBULATORY_CARE_PROVIDER_SITE_OTHER): Payer: Medicare Other | Admitting: Family Medicine

## 2016-03-02 VITALS — BP 126/68 | HR 63 | Temp 97.7°F | Resp 16 | Ht 68.0 in | Wt 146.4 lb

## 2016-03-02 DIAGNOSIS — R630 Anorexia: Secondary | ICD-10-CM

## 2016-03-02 DIAGNOSIS — Z23 Encounter for immunization: Secondary | ICD-10-CM

## 2016-03-02 DIAGNOSIS — F0280 Dementia in other diseases classified elsewhere without behavioral disturbance: Secondary | ICD-10-CM

## 2016-03-02 DIAGNOSIS — I10 Essential (primary) hypertension: Secondary | ICD-10-CM | POA: Diagnosis not present

## 2016-03-02 DIAGNOSIS — E559 Vitamin D deficiency, unspecified: Secondary | ICD-10-CM

## 2016-03-02 DIAGNOSIS — G3109 Other frontotemporal dementia: Secondary | ICD-10-CM

## 2016-03-02 DIAGNOSIS — N183 Chronic kidney disease, stage 3 unspecified: Secondary | ICD-10-CM

## 2016-03-02 DIAGNOSIS — E785 Hyperlipidemia, unspecified: Secondary | ICD-10-CM

## 2016-03-02 LAB — BASIC METABOLIC PANEL WITH GFR
BUN: 24 mg/dL (ref 7–25)
CO2: 27 mmol/L (ref 20–31)
Calcium: 9.7 mg/dL (ref 8.6–10.4)
Chloride: 105 mmol/L (ref 98–110)
Creat: 1.44 mg/dL — ABNORMAL HIGH (ref 0.60–0.88)
GFR, EST NON AFRICAN AMERICAN: 31 mL/min — AB (ref >=60)
GFR, Est African American: 35 mL/min — ABNORMAL LOW (ref >=60)
GLUCOSE: 113 mg/dL — AB (ref 65–99)
POTASSIUM: 3.5 mmol/L (ref 3.5–5.3)
Sodium: 142 mmol/L (ref 135–146)

## 2016-03-02 MED ORDER — ATORVASTATIN CALCIUM 40 MG PO TABS: ORAL_TABLET | ORAL | 1 refills | Status: DC

## 2016-03-02 MED ORDER — MIRTAZAPINE 15 MG PO TABS: 15.0000 mg | ORAL_TABLET | Freq: Every day | ORAL | 1 refills | Status: DC

## 2016-03-02 MED ORDER — DONEPEZIL HCL 10 MG PO TABS: 10.0000 mg | ORAL_TABLET | Freq: Every day | ORAL | 1 refills | Status: DC

## 2016-03-02 MED ORDER — ATENOLOL-CHLORTHALIDONE 50-25 MG PO TABS: 0.5000 | ORAL_TABLET | Freq: Two times a day (BID) | ORAL | 1 refills | Status: DC

## 2016-03-02 NOTE — Progress Notes (Signed)
Name: Meredith Wagner   MRN: [id number]    DOB: Apr 28, 1917   Date:03/02/2016       Progress Note  Subjective  Chief Complaint  Chief Complaint  Patient presents with  . Medication Refill    6 month F/U  . Hypertension    Denies any symptoms  . Hyperlipidemia  . Chronic Kidney Disease    HPI  HTN: doing well, bp is at goal, no dizziness, no chest pain or palpitation, denies side effects of medication.  Hyperlipidemia: taking Atorvastatin and denies myalgias, last labs at goal.  Frontotemporal dementia: she is stable, appetite is stable  with Remeron, but she has lost almost 4 lbs since last visit, lives with her daughter, normal behavior, cooperative, she is able to shower, eat, prepares breakfast. Daughter dispenses her medication and takes care of the house. Daughter states she repeats herself now.   CKI: time to recheck labs, denies pruritus, or edema. She has normal urine output  Crohn's disease: doing well, sees GI  every  6 months, no recent abdominal pain, diarrhea  or rectal bleeding, taking medication as prescribed - Asacol .   Patient Active Problem List   Diagnosis Date Noted  . Lack of appetite 08/25/2014  . Hearing loss 08/25/2014  . 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 Chronic kidney disease, stage III (moderate) 08/22/2014  . Crohn's disease of large bowel ([place]) 08/22/2014  . Hereditary essential tremor 08/22/2014  . History of iron deficiency anemia 08/22/2014  . OP (osteoporosis) 08/22/2014  . Dementia, frontotemporal 06/28/2009  . Vitamin D deficiency 09/28/2008  . 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 Benign essential HTN 06/15/2006  . Dyslipidemia 06/15/2006    Past Surgical History:  Procedure Laterality Date  . ABDOMINAL HYSTERECTOMY  09/07/2009    History reviewed. No pertinent family history.  Social History   Social History  . Marital status: Legally Separated    Spouse name: N/A  . Number of children: N/A  . Years of education:  N/A   Occupational History  . Not on file.   Social History Main Topics  . Smoking status: Never Smoker  . Smokeless tobacco: Never Used  . Alcohol use No  . Drug use: No  . Sexual activity: Not Currently   Other Topics Concern  . Not on file   Social History Narrative   Lives with her daughter.         Current Outpatient Prescriptions:  .  amLODipine (NORVASC) 10 MG tablet, take 1 tablet by mouth once daily, Disp: 90 tablet, Rfl: 1 .  atenolol-chlorthalidone (TENORETIC) 50-25 MG tablet, Take 0.5 tablets by mouth 2 (two) times daily., Disp: 45 tablet, Rfl: 1 .  atorvastatin (LIPITOR) 40 MG tablet, take 1 tablet by mouth once daily AT 6 P.M., Disp: 90 tablet, Rfl: 1 .  donepezil (ARICEPT) 10 MG tablet, Take 1 tablet (10 mg total) by mouth daily., Disp: 90 tablet, Rfl: 1 .  Mesalamine 800 MG TBEC, Take 2 tablets by mouth 2 (two) times daily., Disp: , Rfl:  .  mirtazapine (REMERON) 15 MG tablet, Take 1 tablet (15 mg total) by mouth at bedtime., Disp: 90 tablet, Rfl: 1 .  RA VITAMIN D-3 2000 units CAPS, take 1 capsule by mouth once daily, Disp: 30 capsule, Rfl: 12  Allergies  Allergen Reactions  . 2,4-D Dimethylamine (Amisol) Swelling  . Ace Inhibitors     Other reaction(s): Angioedema  . Niacin Er   . Penicillins      ROS  Constitutional:  Negative for fever or weight change.  Respiratory: Negative for cough and shortness of breath.   Cardiovascular: Negative for chest pain or palpitations.  Gastrointestinal: Negative for abdominal pain, no bowel changes.  Musculoskeletal: negative for gait problem, no  joint swelling.  Skin: Negative for rash.  Neurological: Negative for dizziness or headache.  No other specific complaints in a complete review of systems (except as listed in HPI above).  Objective  Vitals:   03/02/16 0840  BP: 126/68  Pulse: 63  Resp: 16  Temp: 97.7 F (36.5 C)  TempSrc: Oral  SpO2: 91%  Weight: 146 lb 6.4 oz (66.4 kg)  Height: 5' 8"  (1.727  m)    Body mass index is 22.26 kg/m.  Physical Exam  Constitutional: Patient appears well-developed and well-nourished. Obese  No distress.  HEENT: head atraumatic, normocephalic, pupils equal and reactive to light,  neck supple, throat within normal limits. Difficulty hearing.  Cardiovascular: Normal rate, regular rhythm and normal heart sounds.  No murmur heard. No BLE edema. Pulmonary/Chest: Effort normal and breath sounds normal. No respiratory distress. Abdominal: Soft.  There is no tenderness. Psychiatric: Patient has a normal mood and affect. behavior is normal. Judgment and thought content normal.  PHQ2/9: Depression screen [facility] 2/9 03/02/2016 03/02/2015 08/25/2014  Decreased Interest 0 0 0  Down, Depressed, Hopeless 0 0 0  PHQ - 2 Score 0 0 0    Fall Risk: Fall Risk  03/02/2016 03/02/2015 08/25/2014  Falls in the past year? No No No     Functional Status Survey: Is the patient deaf or have difficulty hearing?: Yes Does the patient have difficulty seeing, even when wearing glasses/contacts?: No Does the patient have difficulty concentrating, remembering, or making decisions?: Yes Does the patient have difficulty walking or climbing stairs?: No Does the patient have difficulty dressing or bathing?: No Does the patient have difficulty doing errands alone such as visiting a doctor's office or shopping?: Yes    Assessment & Plan  1. Benign essential HTN  - atenolol-chlorthalidone (TENORETIC) 50-25 MG tablet; Take 0.5 tablets by mouth 2 (two) times daily.  Dispense: 45 tablet; Refill: 1  2. Dyslipidemia  - atorvastatin (LIPITOR) 40 MG tablet; take 1 tablet by mouth once daily AT 6 P.M.  Dispense: 90 tablet; Refill: 1  3. Dementia, frontotemporal  - donepezil (ARICEPT) 10 MG tablet; Take 1 tablet (10 mg total) by mouth daily.  Dispense: 90 tablet; Refill: 1  4. Vitamin D deficiency  Normal 6 months ago  5. Lack of appetite  - mirtazapine (REMERON) 15 MG tablet; Take  1 tablet (15 mg total) by mouth at bedtime.  Dispense: 90 tablet; Refill: 1  6. Chronic kidney disease, stage III (moderate)  - BASIC METABOLIC PANEL WITH GFR

## 2016-05-25 ENCOUNTER — Other Ambulatory Visit: Payer: Self-pay | Admitting: Family Medicine

## 2016-06-03 ENCOUNTER — Other Ambulatory Visit: Payer: Self-pay | Admitting: Family Medicine

## 2016-06-03 DIAGNOSIS — E559 Vitamin D deficiency, unspecified: Secondary | ICD-10-CM

## 2016-07-22 ENCOUNTER — Other Ambulatory Visit: Payer: Self-pay | Admitting: Family Medicine

## 2016-08-20 ENCOUNTER — Other Ambulatory Visit: Payer: Self-pay | Admitting: Family Medicine

## 2016-08-20 DIAGNOSIS — F0280 Dementia in other diseases classified elsewhere without behavioral disturbance: Secondary | ICD-10-CM

## 2016-08-20 DIAGNOSIS — G3109 Other frontotemporal dementia: Principal | ICD-10-CM

## 2016-09-05 ENCOUNTER — Encounter: Payer: Self-pay | Admitting: Family Medicine

## 2016-09-05 ENCOUNTER — Ambulatory Visit (INDEPENDENT_AMBULATORY_CARE_PROVIDER_SITE_OTHER): Payer: Medicare Other | Admitting: Family Medicine

## 2016-09-05 VITALS — BP 124/75 | HR 61 | Temp 97.8°F | Resp 16 | Ht 68.0 in | Wt 152.0 lb

## 2016-09-05 DIAGNOSIS — R630 Anorexia: Secondary | ICD-10-CM | POA: Diagnosis not present

## 2016-09-05 DIAGNOSIS — I10 Essential (primary) hypertension: Secondary | ICD-10-CM | POA: Diagnosis not present

## 2016-09-05 DIAGNOSIS — G250 Essential tremor: Secondary | ICD-10-CM

## 2016-09-05 DIAGNOSIS — K5010 Crohn's disease of large intestine without complications: Secondary | ICD-10-CM

## 2016-09-05 DIAGNOSIS — Z23 Encounter for immunization: Secondary | ICD-10-CM | POA: Diagnosis not present

## 2016-09-05 DIAGNOSIS — E559 Vitamin D deficiency, unspecified: Secondary | ICD-10-CM | POA: Diagnosis not present

## 2016-09-05 DIAGNOSIS — N184 Chronic kidney disease, stage 4 (severe): Secondary | ICD-10-CM

## 2016-09-05 DIAGNOSIS — E785 Hyperlipidemia, unspecified: Secondary | ICD-10-CM | POA: Diagnosis not present

## 2016-09-05 DIAGNOSIS — Z862 Personal history of diseases of the blood and blood-forming organs and certain disorders involving the immune mechanism: Secondary | ICD-10-CM

## 2016-09-05 LAB — CBC WITH DIFFERENTIAL/PLATELET
BASOS PCT: 1 %
Basophils Absolute: 108 cells/uL (ref 0–200)
EOS ABS: 108 {cells}/uL (ref 15–500)
Eosinophils Relative: 1 %
HEMATOCRIT: 40.3 % (ref 35.0–45.0)
Hemoglobin: 13.3 g/dL (ref 11.7–15.5)
Lymphocytes Relative: 33 %
Lymphs Abs: 3564 cells/uL (ref 850–3900)
MCH: 29.7 pg (ref 27.0–33.0)
MCHC: 33 g/dL (ref 32.0–36.0)
MCV: 90 fL (ref 80.0–100.0)
MONO ABS: 864 {cells}/uL (ref 200–950)
MPV: 10.4 fL (ref 7.5–12.5)
Monocytes Relative: 8 %
NEUTROS ABS: 6156 {cells}/uL (ref 1500–7800)
Neutrophils Relative %: 57 %
PLATELETS: 293 10*3/uL (ref 140–400)
RBC: 4.48 MIL/uL (ref 3.80–5.10)
RDW: 14.3 % (ref 11.0–15.0)
WBC: 10.8 10*3/uL (ref 3.8–10.8)

## 2016-09-05 MED ORDER — MIRTAZAPINE 15 MG PO TABS: 15.0000 mg | ORAL_TABLET | Freq: Every day | ORAL | 1 refills | Status: DC

## 2016-09-05 MED ORDER — ATENOLOL-CHLORTHALIDONE 50-25 MG PO TABS: 0.5000 | ORAL_TABLET | Freq: Two times a day (BID) | ORAL | 1 refills | Status: DC

## 2016-09-05 MED ORDER — ATORVASTATIN CALCIUM 40 MG PO TABS: ORAL_TABLET | ORAL | 1 refills | Status: DC

## 2016-09-05 NOTE — Progress Notes (Signed)
Name: Meredith Wagner   MRN: [id number]    DOB: [birth year]   Date:09/05/2016       Progress Note  Subjective  Chief Complaint  Chief Complaint  Patient presents with  . Follow-up    6 mo  . Hyperlipidemia  . Crohn's Disease    HPI  HTN: doing well, bp is at goal, no dizziness, no chest pain or palpitation, denies side effects of medication. EKG is in the chart.  Hyperlipidemia: taking Atorvastatin and denies myalgias, we will recheck labs  Frontotemporal dementia: she is stable, appetite is stable  with Remeron,she gained 6 lbs since last visit, lives with her daughter, normal behavior, cooperative, she is able to shower, eat, prepares breakfast. Daughter dispenses her medication and takes care of the house.    CKI: time to recheck labs, denies pruritus, or edema. She has normal urine output, she does not want to see nephrologist   Crohn's disease: doing well, sees GI  every 6 months, no recent abdominal pain, diarrhea or rectal bleeding, taking medication as prescribed - Asacol . We will check CRP   Patient Active Problem List   Diagnosis Date Noted  . Chronic kidney disease (CKD), stage IV (severe) ([place]) 09/05/2016  . Hearing loss 08/25/2014  . 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 Crohn's disease of large bowel ([place]) 08/22/2014  . Hereditary essential tremor 08/22/2014  . History of iron deficiency anemia 08/22/2014  . OP (osteoporosis) 08/22/2014  . Dementia, frontotemporal 06/28/2009  . Vitamin D deficiency 09/28/2008  . 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 Benign essential HTN 06/15/2006  . Dyslipidemia 06/15/2006    Past Surgical History:  Procedure Laterality Date  . ABDOMINAL HYSTERECTOMY  09/07/2009    History reviewed. No pertinent family history.  Social History   Social History  . Marital status: Legally Separated    Spouse name: N/A  . Number of children: N/A  . Years of education: N/A   Occupational History  . Not on file.   Social  History Main Topics  . Smoking status: Never Smoker  . Smokeless tobacco: Never Used  . Alcohol use No  . Drug use: No  . Sexual activity: Not Currently   Other Topics Concern  . Not on file   Social History Narrative   Lives with her daughter.         Current Outpatient Prescriptions:  .  amLODipine (NORVASC) 10 MG tablet, take 1 tablet by mouth once daily, Disp: 90 tablet, Rfl: 1 .  atenolol-chlorthalidone (TENORETIC) 50-25 MG tablet, Take 0.5 tablets by mouth 2 (two) times daily., Disp: 45 tablet, Rfl: 1 .  atorvastatin (LIPITOR) 40 MG tablet, take 1 tablet by mouth once daily AT 6 P.M., Disp: 90 tablet, Rfl: 1 .  donepezil (ARICEPT) 10 MG tablet, take 1 tablet by mouth once daily, Disp: 90 tablet, Rfl: 1 .  Mesalamine 800 MG TBEC, Take 2 tablets by mouth 2 (two) times daily., Disp: , Rfl:  .  mirtazapine (REMERON) 15 MG tablet, Take 1 tablet (15 mg total) by mouth at bedtime., Disp: 90 tablet, Rfl: 1 .  RA VITAMIN D-3 2000 units CAPS, take 1 capsule by mouth once daily, Disp: 30 capsule, Rfl: 12  Allergies  Allergen Reactions  . 2,4-D Dimethylamine (Amisol) Swelling  . Ace Inhibitors     Other reaction(s): Angioedema  . Niacin Er   . Penicillins      ROS  Constitutional: Negative for fever, positive for  weight change.  Respiratory: Negative for  cough and shortness of breath.   Cardiovascular: Negative for chest pain or palpitations.  Gastrointestinal: Negative for abdominal pain, no bowel changes.  Musculoskeletal: Negative for gait problem or joint swelling.  Skin: Negative for rash.  Neurological: Negative for dizziness or headache.  No other specific complaints in a complete review of systems (except as listed in HPI above).  Objective  Vitals:   09/05/16 0802  BP: 124/75  Pulse: 61  Resp: 16  Temp: 97.8 F (36.6 C)  TempSrc: Oral  SpO2: 97%  Weight: 152 lb (68.9 kg)  Height: 5' 8"  (1.727 m)    Body mass index is 23.11 kg/m.  Physical  Exam  Constitutional: Patient appears well-developed and well-nourished.No distress.  HEENT: head atraumatic, normocephalic, pupils equal and reactive to light, neck supple, throat within normal limits Cardiovascular: Normal rate, regular rhythm and normal heart sounds.  No murmur heard. No BLE edema. Pulmonary/Chest: Effort normal and breath sounds normal. No respiratory distress. Abdominal: Soft.  There is no tenderness. Psychiatric: Patient has a normal mood and affect. behavior is normal. Judgment and thought content normal.  PHQ2/9: Depression screen [facility] 2/9 09/05/2016 03/02/2016 03/02/2015 08/25/2014  Decreased Interest 0 0 0 0  Down, Depressed, Hopeless 0 0 0 0  PHQ - 2 Score 0 0 0 0     Fall Risk: Fall Risk  09/05/2016 03/02/2016 03/02/2015 08/25/2014  Falls in the past year? No No No No    Functional Status Survey: Is the patient deaf or have difficulty hearing?: Yes Does the patient have difficulty seeing, even when wearing glasses/contacts?: Yes (glasses) Does the patient have difficulty concentrating, remembering, or making decisions?: Yes Does the patient have difficulty walking or climbing stairs?: No Does the patient have difficulty dressing or bathing?: No Does the patient have difficulty doing errands alone such as visiting a doctor's office or shopping?: Yes    Assessment & Plan  1. Chronic kidney disease (CKD), stage IV (severe) (HCC)  Daughter and patient do not want referral to nephrologist  - CBC with Differential/Platelet - COMPLETE METABOLIC PANEL WITH GFR - Parathyroid hormone, intact (no Ca) - VITAMIN D 25 Hydroxy (Vit-D Deficiency, Fractures)  2. Crohn's disease of large intestine without complication (HCC)  - C-reactive protein  3. History of iron deficiency anemia  Recheck labs  4. Benign essential HTN  At goal, denies dizziness - atenolol-chlorthalidone (TENORETIC) 50-25 MG tablet; Take 0.5 tablets by mouth 2 (two) times daily.  Dispense: 45  tablet; Refill: 1  5. Dyslipidemia  - Lipid panel - atorvastatin (LIPITOR) 40 MG tablet; take 1 tablet by mouth once daily AT 6 P.M.  Dispense: 90 tablet; Refill: 1  6. Hereditary essential tremor  stable  7. Vitamin D deficiency  - VITAMIN D 25 Hydroxy (Vit-D Deficiency, Fractures)  8. Lack of appetite  Doing well, continue medication  - mirtazapine (REMERON) 15 MG tablet; Take 1 tablet (15 mg total) by mouth at bedtime.  Dispense: 90 tablet; Refill: 1  9. Needs flu shot  - Flu vaccine HIGH DOSE PF

## 2016-09-06 LAB — COMPLETE METABOLIC PANEL WITH GFR
ALT: 10 U/L (ref 6–29)
AST: 16 U/L (ref 10–35)
Albumin: 4.1 g/dL (ref 3.6–5.1)
Alkaline Phosphatase: 101 U/L (ref 33–130)
BILIRUBIN TOTAL: 0.5 mg/dL (ref 0.2–1.2)
BUN: 24 mg/dL (ref 7–25)
CO2: 24 mmol/L (ref 20–32)
Calcium: 9.5 mg/dL (ref 8.6–10.4)
Chloride: 103 mmol/L (ref 98–110)
Creat: 1.32 mg/dL — ABNORMAL HIGH (ref 0.60–0.88)
GFR, EST AFRICAN AMERICAN: 39 mL/min — AB (ref >=60)
GFR, Est Non African American: 34 mL/min — ABNORMAL LOW (ref >=60)
Glucose, Bld: 114 mg/dL — ABNORMAL HIGH (ref 65–99)
Potassium: 3.5 mmol/L (ref 3.5–5.3)
Sodium: 142 mmol/L (ref 135–146)
TOTAL PROTEIN: 7.5 g/dL (ref 6.1–8.1)

## 2016-09-06 LAB — LIPID PANEL
CHOL/HDL RATIO: 2.4 ratio (ref <5.0)
CHOLESTEROL: 178 mg/dL (ref <200)
HDL: 74 mg/dL (ref >50)
LDL CALC: 86 mg/dL (ref <100)
Triglycerides: 91 mg/dL (ref <150)
VLDL: 18 mg/dL (ref <30)

## 2016-09-06 LAB — C-REACTIVE PROTEIN: CRP: 26.3 mg/L — ABNORMAL HIGH (ref <8.0)

## 2016-09-06 LAB — VITAMIN D 25 HYDROXY (VIT D DEFICIENCY, FRACTURES): VIT D 25 HYDROXY: 65 ng/mL (ref 30–100)

## 2016-09-06 LAB — PARATHYROID HORMONE, INTACT (NO CA): PTH: 83 pg/mL — ABNORMAL HIGH (ref 14–64)

## 2016-09-07 ENCOUNTER — Encounter: Payer: Self-pay | Admitting: Family Medicine

## 2016-09-07 DIAGNOSIS — R739 Hyperglycemia, unspecified: Secondary | ICD-10-CM | POA: Insufficient documentation

## 2016-11-17 ENCOUNTER — Other Ambulatory Visit: Payer: Self-pay | Admitting: Family Medicine

## 2016-11-17 DIAGNOSIS — I10 Essential (primary) hypertension: Secondary | ICD-10-CM

## 2017-02-18 ENCOUNTER — Other Ambulatory Visit: Payer: Self-pay | Admitting: Family Medicine

## 2017-02-18 DIAGNOSIS — G3109 Other frontotemporal dementia: Principal | ICD-10-CM

## 2017-02-18 DIAGNOSIS — F0280 Dementia in other diseases classified elsewhere without behavioral disturbance: Secondary | ICD-10-CM

## 2017-03-12 ENCOUNTER — Ambulatory Visit: Payer: Medicare Other | Admitting: Family Medicine

## 2017-03-12 ENCOUNTER — Encounter: Payer: Self-pay | Admitting: Family Medicine

## 2017-03-12 ENCOUNTER — Ambulatory Visit: Payer: Medicare Other

## 2017-03-12 ENCOUNTER — Ambulatory Visit (INDEPENDENT_AMBULATORY_CARE_PROVIDER_SITE_OTHER): Payer: Medicare Other | Admitting: Family Medicine

## 2017-03-12 VITALS — BP 124/60 | HR 65 | Resp 16 | Ht 68.0 in | Wt 148.6 lb

## 2017-03-12 DIAGNOSIS — R630 Anorexia: Secondary | ICD-10-CM | POA: Diagnosis not present

## 2017-03-12 DIAGNOSIS — R7982 Elevated C-reactive protein (CRP): Secondary | ICD-10-CM

## 2017-03-12 DIAGNOSIS — G3109 Other frontotemporal dementia: Secondary | ICD-10-CM | POA: Diagnosis not present

## 2017-03-12 DIAGNOSIS — F0280 Dementia in other diseases classified elsewhere without behavioral disturbance: Secondary | ICD-10-CM

## 2017-03-12 DIAGNOSIS — I10 Essential (primary) hypertension: Secondary | ICD-10-CM

## 2017-03-12 DIAGNOSIS — G250 Essential tremor: Secondary | ICD-10-CM | POA: Diagnosis not present

## 2017-03-12 DIAGNOSIS — Z862 Personal history of diseases of the blood and blood-forming organs and certain disorders involving the immune mechanism: Secondary | ICD-10-CM

## 2017-03-12 DIAGNOSIS — N184 Chronic kidney disease, stage 4 (severe): Secondary | ICD-10-CM

## 2017-03-12 DIAGNOSIS — K5010 Crohn's disease of large intestine without complications: Secondary | ICD-10-CM

## 2017-03-12 DIAGNOSIS — E785 Hyperlipidemia, unspecified: Secondary | ICD-10-CM

## 2017-03-12 MED ORDER — AMLODIPINE BESYLATE 5 MG PO TABS: 5.0000 mg | ORAL_TABLET | Freq: Every day | ORAL | 1 refills | Status: DC

## 2017-03-12 MED ORDER — ATORVASTATIN CALCIUM 40 MG PO TABS: ORAL_TABLET | ORAL | 1 refills | Status: DC

## 2017-03-12 MED ORDER — MIRTAZAPINE 15 MG PO TABS: 15.0000 mg | ORAL_TABLET | Freq: Every day | ORAL | 1 refills | Status: DC

## 2017-03-12 MED ORDER — ATENOLOL-CHLORTHALIDONE 50-25 MG PO TABS: 0.5000 | ORAL_TABLET | ORAL | 1 refills | Status: DC

## 2017-03-12 NOTE — Progress Notes (Signed)
Name: Meredith Wagner   MRN: [id number]    DOB: 10/24/1917   Date:03/12/2017       Progress Note  Subjective  Chief Complaint  Chief Complaint  Patient presents with  . Hypertension    HPI  HTN: doing well, bp is at goal, no dizziness, no chest pain or palpitation, denies side effects of medication. EKG is in the chart. Her bp has been towards low end of normal, nocturia twice per night, we will decrease Norvasc from 10 to 5 and also take only half tenoretic in am instead of half twice daily.   Hyperlipidemia: taking Atorvastatin and denies myalgias  Frontotemporal dementia: she is stable, appetite is stable- daughter states eats better when she sits down with her. She liives with her daughter, normal behavior, cooperative, she is able to shower, clothes herself, eating independently even with tremors, prepares breakfast. Daughter dispenses her medication and takes care of the house.    CKI: time to recheck labs, denies pruritus, or edema. She has normal urine output, she does not want to see nephrologist. Daughter is here with her   Crohn's disease: doing well, sees GI every 6 months, no recent abdominal pain, diarrhea or rectal bleeding, taking medication as prescribed - Asacol .We will check CRP, last one was heavy.    Patient Active Problem List   Diagnosis Date Noted  . Hyperglycemia 09/07/2016  . Chronic kidney disease (CKD), stage IV (severe) ([place]) 09/05/2016  . Hearing loss 08/25/2014  . 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 Crohn's disease of large bowel ([place]) 08/22/2014  . Hereditary essential tremor 08/22/2014  . History of iron deficiency anemia 08/22/2014  . OP (osteoporosis) 08/22/2014  . Dementia, frontotemporal 06/28/2009  . Vitamin D deficiency 09/28/2008  . 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 Benign essential HTN 06/15/2006  . Dyslipidemia 06/15/2006    Past Surgical History:  Procedure Laterality Date  . ABDOMINAL HYSTERECTOMY  09/07/2009     History reviewed. No pertinent family history.  Social History   Socioeconomic History  . Marital status: Legally Separated    Spouse name: Not on file  . Number of children: Not on file  . Years of education: Not on file  . Highest education level: Not on file  Social Needs  . Financial resource strain: Not on file  . Food insecurity - worry: Not on file  . Food insecurity - inability: Not on file  . Transportation needs - medical: Not on file  . Transportation needs - non-medical: Not on file  Occupational History  . Not on file  Tobacco Use  . Smoking status: Never Smoker  . Smokeless tobacco: Never Used  Substance and Sexual Activity  . Alcohol use: No    Alcohol/week: 0.0 oz  . Drug use: No  . Sexual activity: Not Currently  Other Topics Concern  . Not on file  Social History Narrative   Lives with her daughter.      Current Outpatient Medications:  .  amLODipine (NORVASC) 5 MG tablet, Take 1 tablet (5 mg total) by mouth daily., Disp: 90 tablet, Rfl: 1 .  atenolol-chlorthalidone (TENORETIC) 50-25 MG tablet, Take 0.5 tablets by mouth every morning., Disp: 45 tablet, Rfl: 1 .  atorvastatin (LIPITOR) 40 MG tablet, take 1 tablet by mouth once daily AT 6 P.M., Disp: 90 tablet, Rfl: 1 .  donepezil (ARICEPT) 10 MG tablet, take 1 tablet by mouth once daily, Disp: 90 tablet, Rfl: 1 .  Mesalamine 800 MG TBEC, Take 2 tablets  by mouth 2 (two) times daily., Disp: , Rfl:  .  mirtazapine (REMERON) 15 MG tablet, Take 1 tablet (15 mg total) by mouth at bedtime., Disp: 90 tablet, Rfl: 1 .  RA VITAMIN D-3 2000 units CAPS, take 1 capsule by mouth once daily, Disp: 30 capsule, Rfl: 12  Allergies  Allergen Reactions  . 2,4-D Dimethylamine (Amisol) Swelling  . Ace Inhibitors     Other reaction(s): Angioedema  . Niacin Er   . Penicillins      ROS  Constitutional: Negative for fever or weight change.  Respiratory: Negative for cough and shortness of breath.   Cardiovascular:  Negative for chest pain or palpitations.  Gastrointestinal: Negative for abdominal pain, no bowel changes.  Musculoskeletal: Negative for gait problem or joint swelling.  Skin: Negative for rash.  Neurological: Negative for dizziness or headache.  No other specific complaints in a complete review of systems (except as listed in HPI above).   Objective  Vitals:   03/12/17 0753  BP: 124/60  Pulse: 65  Resp: 16  SpO2: 96%  Weight: 148 lb 9.6 oz (67.4 kg)  Height: 5' 8"  (1.727 m)    Body mass index is 22.59 kg/m.  Physical Exam  Constitutional: Patient appears well-developed and well-nourished.No distress.  HEENT: head atraumatic, normocephalic, pupils equal and reactive to light, neck supple, throat within normal limits Cardiovascular: Normal rate, regular rhythm and normal heart sounds.  No murmur heard. No BLE edema. Pulmonary/Chest: Effort normal and breath sounds normal. No respiratory distress. Abdominal: Soft.  There is no tenderness Neurological: hand tremors with movement left worse than right.  Psychiatric: Patient has a normal mood and affect. Cooperative  PHQ2/9: Depression screen [facility] 2/9 09/05/2016 03/02/2016 03/02/2015 08/25/2014  Decreased Interest 0 0 0 0  Down, Depressed, Hopeless 0 0 0 0  PHQ - 2 Score 0 0 0 0     Fall Risk: Fall Risk  03/12/2017 09/05/2016 03/02/2016 03/02/2015 08/25/2014  Falls in the past year? No No No No No     Functional Status Survey: Is the patient deaf or have difficulty hearing?: No Does the patient have difficulty seeing, even when wearing glasses/contacts?: No Does the patient have difficulty concentrating, remembering, or making decisions?: No Does the patient have difficulty walking or climbing stairs?: No Does the patient have difficulty dressing or bathing?: No Does the patient have difficulty doing errands alone such as visiting a doctor's office or shopping?: No   Assessment & Plan  1. Benign essential HTN  BP towards low  end of normal, change to taking norvasc 5 mg at night and only half of tenoretic daily, not longer evening dose. Return to the office within the next couple of weeks for a bp check  - amLODipine (NORVASC) 5 MG tablet; Take 1 tablet (5 mg total) by mouth daily.  Dispense: 90 tablet; Refill: 1 - atenolol-chlorthalidone (TENORETIC) 50-25 MG tablet; Take 0.5 tablets by mouth every morning.  Dispense: 45 tablet; Refill: 1  2. Dyslipidemia  - atorvastatin (LIPITOR) 40 MG tablet; take 1 tablet by mouth once daily AT 6 P.M.  Dispense: 90 tablet; Refill: 1  3. Dementia, frontotemporal  Stable  4. Lack of appetite  - mirtazapine (REMERON) 15 MG tablet; Take 1 tablet (15 mg total) by mouth at bedtime.  Dispense: 90 tablet; Refill: 1  5. Crohn's disease of large intestine without complication (HCC)  No recent flares, feeling well  6. Chronic kidney disease (CKD), stage IV (severe) (HCC)  - COMPLETE METABOLIC  PANEL WITH GFR - Hemoglobin and hematocrit, blood  7. History of iron deficiency anemia  Resolved   8. Hereditary essential tremor  stable  9. Elevated C-reactive protein  - C-reactive protein

## 2017-03-13 LAB — COMPLETE METABOLIC PANEL WITH GFR
AG Ratio: 1.2 (calc) (ref 1.0–2.5)
ALT: 10 U/L (ref 6–29)
AST: 17 U/L (ref 10–35)
Albumin: 3.9 g/dL (ref 3.6–5.1)
Alkaline phosphatase (APISO): 88 U/L (ref 33–130)
BUN/Creatinine Ratio: 16 (calc) (ref 6–22)
BUN: 20 mg/dL (ref 7–25)
CALCIUM: 9.5 mg/dL (ref 8.6–10.4)
CO2: 28 mmol/L (ref 20–32)
CREATININE: 1.23 mg/dL — AB (ref 0.60–0.88)
Chloride: 103 mmol/L (ref 98–110)
GFR, EST NON AFRICAN AMERICAN: 37 mL/min/{1.73_m2} — AB (ref >=60)
GFR, Est African American: 43 mL/min/{1.73_m2} — ABNORMAL LOW (ref >=60)
GLOBULIN: 3.3 g/dL (ref 1.9–3.7)
Glucose, Bld: 112 mg/dL — ABNORMAL HIGH (ref 65–99)
Potassium: 3.3 mmol/L — ABNORMAL LOW (ref 3.5–5.3)
SODIUM: 140 mmol/L (ref 135–146)
Total Bilirubin: 0.6 mg/dL (ref 0.2–1.2)
Total Protein: 7.2 g/dL (ref 6.1–8.1)

## 2017-03-13 LAB — C-REACTIVE PROTEIN: CRP: 10.4 mg/L — AB (ref <8.0)

## 2017-03-13 LAB — HEMOGLOBIN AND HEMATOCRIT, BLOOD
HEMATOCRIT: 37.7 % (ref 35.0–45.0)
Hemoglobin: 12.8 g/dL (ref 11.7–15.5)

## 2017-03-14 ENCOUNTER — Telehealth: Payer: Self-pay

## 2017-03-14 NOTE — Telephone Encounter (Signed)
Called pt to sched AWV w/ [facility]. LVM requesting returned call.  

## 2017-06-01 ENCOUNTER — Other Ambulatory Visit: Payer: Self-pay

## 2017-06-01 DIAGNOSIS — I10 Essential (primary) hypertension: Secondary | ICD-10-CM

## 2017-06-01 MED ORDER — ATENOLOL-CHLORTHALIDONE 50-25 MG PO TABS: 0.5000 | ORAL_TABLET | ORAL | 3 refills | Status: DC

## 2017-06-01 NOTE — Telephone Encounter (Signed)
Refill request for Hypertension medication:  Atenolol-Chlorthalidone 50-25  Last office visit pertaining to hypertension: 03/12/2017  BP Readings from Last 3 Encounters:  03/12/17 124/60  09/05/16 124/75  03/02/16 126/68     Lab Results  Component Value Date   CREATININE 1.23 (H) 03/12/2017   BUN 20 03/12/2017   NA 140 03/12/2017   K 3.3 (L) 03/12/2017   CL 103 03/12/2017   CO2 28 03/12/2017   Follow-ups on file. 09/11/2017

## 2017-06-19 ENCOUNTER — Other Ambulatory Visit: Payer: Self-pay | Admitting: Family Medicine

## 2017-06-19 DIAGNOSIS — E559 Vitamin D deficiency, unspecified: Secondary | ICD-10-CM

## 2017-06-19 NOTE — Telephone Encounter (Signed)
Refill request for general medication. Vitamin D  Last office visit 03/12/2017  Follow up on 09/12/17

## 2017-08-17 ENCOUNTER — Other Ambulatory Visit: Payer: Self-pay | Admitting: Family Medicine

## 2017-09-11 ENCOUNTER — Encounter: Payer: Self-pay | Admitting: Family Medicine

## 2017-09-11 ENCOUNTER — Ambulatory Visit (INDEPENDENT_AMBULATORY_CARE_PROVIDER_SITE_OTHER): Payer: Medicare Other | Admitting: Family Medicine

## 2017-09-11 ENCOUNTER — Other Ambulatory Visit: Payer: Self-pay | Admitting: Family Medicine

## 2017-09-11 ENCOUNTER — Ambulatory Visit (INDEPENDENT_AMBULATORY_CARE_PROVIDER_SITE_OTHER): Payer: Medicare Other

## 2017-09-11 VITALS — BP 146/68 | HR 67 | Temp 97.7°F | Resp 14 | Ht 68.0 in | Wt 150.5 lb

## 2017-09-11 DIAGNOSIS — F0280 Dementia in other diseases classified elsewhere without behavioral disturbance: Secondary | ICD-10-CM

## 2017-09-11 DIAGNOSIS — E785 Hyperlipidemia, unspecified: Secondary | ICD-10-CM | POA: Diagnosis not present

## 2017-09-11 DIAGNOSIS — Z0000 Encounter for general adult medical examination without abnormal findings: Secondary | ICD-10-CM | POA: Diagnosis not present

## 2017-09-11 DIAGNOSIS — Z23 Encounter for immunization: Secondary | ICD-10-CM | POA: Diagnosis not present

## 2017-09-11 DIAGNOSIS — I10 Essential (primary) hypertension: Secondary | ICD-10-CM

## 2017-09-11 DIAGNOSIS — Z862 Personal history of diseases of the blood and blood-forming organs and certain disorders involving the immune mechanism: Secondary | ICD-10-CM

## 2017-09-11 DIAGNOSIS — N183 Chronic kidney disease, stage 3 unspecified: Secondary | ICD-10-CM

## 2017-09-11 DIAGNOSIS — G3109 Other frontotemporal dementia: Secondary | ICD-10-CM

## 2017-09-11 DIAGNOSIS — R630 Anorexia: Secondary | ICD-10-CM

## 2017-09-11 DIAGNOSIS — R7982 Elevated C-reactive protein (CRP): Secondary | ICD-10-CM | POA: Diagnosis not present

## 2017-09-11 DIAGNOSIS — K5010 Crohn's disease of large intestine without complications: Secondary | ICD-10-CM

## 2017-09-11 DIAGNOSIS — G250 Essential tremor: Secondary | ICD-10-CM

## 2017-09-11 DIAGNOSIS — E559 Vitamin D deficiency, unspecified: Secondary | ICD-10-CM

## 2017-09-11 MED ORDER — MIRTAZAPINE 15 MG PO TABS: 15.0000 mg | ORAL_TABLET | Freq: Every day | ORAL | 1 refills | Status: DC

## 2017-09-11 MED ORDER — DONEPEZIL HCL 10 MG PO TABS: 10.0000 mg | ORAL_TABLET | Freq: Every day | ORAL | 1 refills | Status: DC

## 2017-09-11 MED ORDER — ATORVASTATIN CALCIUM 40 MG PO TABS: ORAL_TABLET | ORAL | 1 refills | Status: DC

## 2017-09-11 MED ORDER — AMLODIPINE BESYLATE 5 MG PO TABS: 5.0000 mg | ORAL_TABLET | Freq: Every day | ORAL | 1 refills | Status: DC

## 2017-09-11 NOTE — Progress Notes (Signed)
Subjective:   Meredith Wagner is a 84 y.o. female who presents for an Initial Medicare Annual Wellness Visit.  Review of Systems    N/A  Cardiac Risk Factors include: advanced age (>81mn, >>34women);dyslipidemia;hypertension;sedentary lifestyle     Objective:    Today's Vitals   09/11/17 0819  BP: (!) 146/68  Pulse: 67  Resp: 14  Temp: 97.7 F (36.5 C)  TempSrc: Oral  SpO2: 95%  Weight: 150 lb 8 oz (68.3 kg)  Height: 5' 8"  (1.727 m)   Body mass index is 22.88 kg/m.  Advanced Directives 09/11/2017 09/05/2016 03/02/2016 03/02/2015 08/25/2014  Does Patient Have a Medical Advance Directive? Yes No Yes Yes Yes  Type of [place] - Healthcare Power of [place] Does patient want to make changes to medical advance directive? - - - No - Patient declined -  Copy of [place] Chart? Yes - Yes No - copy requested No - copy requested    Current Medications (verified) Outpatient Encounter Medications as of 09/11/2017  Medication Sig  . amLODipine (NORVASC) 5 MG tablet Take 1 tablet (5 mg total) by mouth daily.  .Marland Kitchenatenolol-chlorthalidone (TENORETIC) 50-25 MG tablet Take 0.5 tablets by mouth every morning.  .Marland Kitchenatorvastatin (LIPITOR) 40 MG tablet take 1 tablet by mouth once daily AT 6 P.M.  . donepezil (ARICEPT) 10 MG tablet take 1 tablet by mouth once daily  . Mesalamine 800 MG TBEC Take 2 tablets by mouth 2 (two) times daily.  . mirtazapine (REMERON) 15 MG tablet Take 1 tablet (15 mg total) by mouth at bedtime.  .Marland KitchenRA VITAMIN D-3 2000 units CAPS take 1 capsule by mouth once daily   No facility-administered encounter medications on file as of 09/11/2017.     Allergies (verified) 2,4-d dimethylamine (amisol); Ace inhibitors; Niacin er; and Penicillins   History: Past Medical History:  Diagnosis Date  . ACE inhibitor-aggravated angioedema   .  Anemia   . Appetite lost   . Crohn's disease ([place]   . Familial tremor   . Frontotemporal dementia   . Hyperlipidemia   . Hypertension   . Hypokalemia   . Low kidney function   . Osteoporosis   . Vitamin D deficiency    Past Surgical History:  Procedure Laterality Date  . ABDOMINAL HYSTERECTOMY  09/07/2009   History reviewed. No pertinent family history. Social History   Socioeconomic History  . Marital status: Divorced    Spouse name: Not on file  . Number of children: 4  . Years of education: Not on file  . Highest education level: 11th grade  Occupational History    Employer: RETIRED  Social Needs  . Financial resource strain: Not hard at all  . Food insecurity:    Worry: Never true    Inability: Never true  . Transportation needs:    Medical: No    Non-medical: No  Tobacco Use  . Smoking status: Never Smoker  . Smokeless tobacco: Never Used  . Tobacco comment: smoking cessation materials not required  Substance and Sexual Activity  . Alcohol use: No    Alcohol/week: 0.0 standard drinks  . Drug use: No  . Sexual activity: Not Currently  Lifestyle  . Physical activity:    Days per week: 0 days    Minutes per session: 0 min  . Stress: Not at all  Relationships  . Social connections:  Talks on phone: Patient refused    Gets together: Patient refused    Attends religious service: Patient refused    Active member of club or organization: Patient refused    Attends meetings of clubs or organizations: Patient refused    Relationship status: Separated  Other Topics Concern  . Not on file  Social History Narrative   Lives with her daughter.     Tobacco Counseling Counseling given: No Comment: smoking cessation materials not required  Clinical Intake:  Pre-visit preparation completed: Yes  Pain : No/denies pain   BMI - recorded: 22.88 Nutritional Status: BMI of 19-24  Normal Nutritional Risks: None Diabetes: No  How often do you need to have  someone help you when you read instructions, pamphlets, or other written materials from your doctor or pharmacy?: 1 - Never  Interpreter Needed?: No  Information entered by :: AEversole, LPN   Activities of Daily Living In your present state of health, do you have any difficulty performing the following activities: 09/11/2017 03/12/2017  Hearing? N N  Comment denies hearing aids -  Vision? N N  Comment wears eyeglasses -  Difficulty concentrating or making decisions? Y N  Walking or climbing stairs? N N  Dressing or bathing? N N  Doing errands, shopping? Y N  Comment dtr transports -  Conservation officer, nature and eating ? N -  Comment denies dentures -  Using the Toilet? N -  In the past six months, have you accidently leaked urine? N -  Do you have problems with loss of bowel control? N -  Managing your Medications? Y -  Comment dtr manages -  Managing your Finances? Y -  Comment dtr manages -  Housekeeping or managing your Housekeeping? N -  Some recent data might be hidden     Immunizations and Health Maintenance Immunization History  Administered Date(s) Administered  . Influenza, High Dose Seasonal PF 09/05/2016, 09/11/2017  . Influenza,inj,Quad PF,6+ Mos 08/25/2014  . Influenza-Unspecified 02/24/2014  . Pneumococcal Conjugate-13 02/14/2013  . Pneumococcal Polysaccharide-23 10/24/2011  . Tdap 06/20/2010  . Zoster 08/19/2007   There are no preventive care reminders to display for this patient.  Patient Care Team: Steele Sizer, MD as PCP - General (Family Medicine) Bertram Gala as Physician Assistant (Gastroenterology)  Indicate any recent Medical Services you may have received from other than [facility] providers in the past year (date may be approximate).     Assessment:   This is a routine wellness examination for Meredith Wagner.  Hearing/Vision screen Vision Screening Comments: Sees Dr. Ellin Mayhew for annual eye exams  Dietary issues and exercise activities  discussed: Current Exercise Habits: The patient does not participate in regular exercise at present, Exercise limited by: None identified  Goals    . DIET - INCREASE WATER INTAKE     Recommend to drink at least 6-8 8oz glasses of water per day.      Depression Screen PHQ 2/9 Scores 09/11/2017 09/05/2016 03/02/2016 03/02/2015 08/25/2014  PHQ - 2 Score 0 0 0 0 0  PHQ- 9 Score 0 - - - -    Fall Risk Fall Risk  09/11/2017 03/12/2017 09/05/2016 03/02/2016 03/02/2015  Falls in the past year? No No No No No  Risk for fall due to : Impaired vision;Medication side effect;Other (Comment) - - - -  Risk for fall due to: Comment wears eyeglasses; dementia - - - -    FALL RISK PREVENTION PERTAINING TO HOME: Is your home free of loose  throw rugs in walkways, pet beds, electrical cords, etc? Yes Is there adequate lighting in your home to reduce risk of falls?  Yes Are there stairs in or around your home WITH handrails? No stairs  ASSISTIVE DEVICES UTILIZED TO PREVENT FALLS: Use of a cane, walker or w/c? No Grab bars in the bathroom? Yes  Shower chair or a place to sit while bathing? No An elevated toilet seat or a handicapped toilet? Yes  Timed Get Up and Go Performed: Yes. Pt ambulated 10 feet within 29 sec. Gait slow, steady and without the use of an assistive device. No intervention required at this time. Fall risk prevention has been discussed.  Community Resource Referral:  Pt declined my offer to send [employer] Referral to Care Guide for a shower chair.  Cognitive Function: UNABLE TO PERFORM  Screening Tests Health Maintenance  Topic Date Due  . TETANUS/TDAP  06/19/2020  . INFLUENZA VACCINE  Completed  . DEXA SCAN  Completed  . PNA vac Low Risk Adult  Completed    Qualifies for Shingles Vaccine? Yes. Zostavax completed 08/19/07. Due for Shingrix. Education has been provided regarding the importance of this vaccine. Pt has been advised to call insurance company to determine out of  pocket expense. Advised may also receive vaccine at local pharmacy or Health Dept. Verbalized acceptance and understanding.  Cancer Screenings: Lung: Low Dose CT Chest recommended if Age 6-80 years, 30 pack-year currently smoking OR have quit w/in 15years. Patient does not qualify. Breast Screening: No longer required Bone Density/Dexa: No longer required Colorectal: No longer required  Additional Screenings: Hepatitis C Screening: Does not qualify   Plan:  I have personally reviewed and addressed the Medicare Annual Wellness questionnaire and have noted the following in the patient's chart:  A. Medical and social history B. Use of alcohol, tobacco or illicit drugs  C. Current medications and supplements D. Functional ability and status E.  Nutritional status F.  Physical activity G. Advance directives H. List of other physicians I.  Hospitalizations, surgeries, and ER visits in previous 12 months J.  [place] such as hearing and vision if needed, cognitive and depression L. Referrals and appointments  In addition, I have reviewed and discussed with patient certain preventive protocols, quality metrics, and best practice recommendations. A written personalized care plan for preventive services as well as general preventive health recommendations were provided to patient.  See attached scanned questionnaire for additional information.   Signed,  Aleatha Borer, LPN Nurse Health Advisor

## 2017-09-11 NOTE — Progress Notes (Signed)
Name: Meredith Wagner   MRN: [id number]    DOB: 11-21-1917   Date:09/11/2017       Progress Note  Subjective  Chief Complaint  Chief Complaint  Patient presents with  . Medication Refill    6 month F/U  . Hypertension  . Hyperlipidemia  . Chron's Disease  . CKI    HPI  HTN: doing well, bp is at goal, no dizziness, no chest pain or palpitation, denies side effects of medication.EKG is in the chart. BP is at goal, she still has nocturia at most twice per night  Hyperlipidemia: taking Atorvastatin and denies myalgias, we will recheck labs   Frontotemporal dementia: she is stable, appetite is stable- daughter states eats better when she sits down with her. She liives with her daughter, normal behavior, cooperative, she is able to shower, clothes herself, eating independently even with tremors, prepares breakfast. Daughter dispenses her medication and takes care of the house. Discussed pill pack and daughter will think about it  CKI: time to recheck labs, denies pruritus, or edema. She has normal urine output, she does not want to see nephrologist. Daughter is here with her.. We will recheck labs She has never seen nephrologist we just monitoring for now because of her age   Crohn's disease: doing well, sees GI every 6 months, no recent abdominal pain, diarrhea or rectal bleeding, taking medication as prescribed - Asacol .No side effects of medication    Patient Active Problem List   Diagnosis Date Noted  . Hyperglycemia 09/07/2016  . Chronic kidney disease (CKD), stage IV (severe) ([place]) 09/05/2016  . Hearing loss 08/25/2014  . 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 Chronic kidney disease, stage III (moderate) ([place]) 08/22/2014  . Crohn's disease of large bowel ([place]) 08/22/2014  . Hereditary essential tremor 08/22/2014  . History of iron deficiency anemia 08/22/2014  . OP (osteoporosis) 08/22/2014  . Dementia, frontotemporal 06/28/2009  . Vitamin D deficiency  09/28/2008  . 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 Benign essential HTN 06/15/2006  . Dyslipidemia 06/15/2006    Past Surgical History:  Procedure Laterality Date  . ABDOMINAL HYSTERECTOMY  09/07/2009    History reviewed. No pertinent family history.  Social History   Socioeconomic History  . Marital status: Divorced    Spouse name: Not on file  . Number of children: 4  . Years of education: Not on file  . Highest education level: 11th grade  Occupational History    Employer: RETIRED  Social Needs  . Financial resource strain: Not hard at all  . Food insecurity:    Worry: Never true    Inability: Never true  . Transportation needs:    Medical: No    Non-medical: No  Tobacco Use  . Smoking status: Never Smoker  . Smokeless tobacco: Never Used  . Tobacco comment: smoking cessation materials not required  Substance and Sexual Activity  . Alcohol use: No    Alcohol/week: 0.0 standard drinks  . Drug use: No  . Sexual activity: Not Currently  Lifestyle  . Physical activity:    Days per week: 0 days    Minutes per session: 0 min  . Stress: Not at all  Relationships  . Social connections:    Talks on phone: Patient refused    Gets together: Patient refused    Attends religious service: Patient refused    Active member of club or organization: Patient refused    Attends meetings of clubs or organizations: Patient refused    Relationship  status: Separated  . Intimate partner violence:    Fear of current or ex partner: No    Emotionally abused: No    Physically abused: No    Forced sexual activity: No  Other Topics Concern  . Not on file  Social History Narrative   Lives with her daughter.      Current Outpatient Medications:  .  atenolol-chlorthalidone (TENORETIC) 50-25 MG tablet, Take 0.5 tablets by mouth every morning., Disp: 45 tablet, Rfl: 3 .  Mesalamine 800 MG TBEC, Take 2 tablets by mouth 2 (two) times daily., Disp: , Rfl:  .  RA VITAMIN D-3 2000 units CAPS,  take 1 capsule by mouth once daily, Disp: 30 capsule, Rfl: 12 .  amLODipine (NORVASC) 5 MG tablet, Take 1 tablet (5 mg total) by mouth daily., Disp: 90 tablet, Rfl: 1 .  atorvastatin (LIPITOR) 40 MG tablet, take 1 tablet by mouth once daily AT 6 P.M., Disp: 90 tablet, Rfl: 1 .  donepezil (ARICEPT) 10 MG tablet, Take 1 tablet (10 mg total) by mouth daily., Disp: 90 tablet, Rfl: 1 .  mirtazapine (REMERON) 15 MG tablet, Take 1 tablet (15 mg total) by mouth at bedtime., Disp: 90 tablet, Rfl: 1  Allergies  Allergen Reactions  . 2,4-D Dimethylamine (Amisol) Swelling  . Ace Inhibitors     Other reaction(s): Angioedema  . Niacin Er   . Penicillins      ROS  Constitutional: Negative for fever or weight change.  Respiratory: Negative for cough and shortness of breath.   Cardiovascular: Negative for chest pain or palpitations.  Gastrointestinal: Negative for abdominal pain, no bowel changes.  Musculoskeletal: Negative for gait problem or joint swelling.  Skin: Negative for rash.  Neurological: Negative for dizziness or headache.  No other specific complaints in a complete review of systems (except as listed in HPI above).  Objective  Vitals:   09/11/17 0902  BP: (!) 146/68  Pulse: 67  Resp: 14  Temp: 97.7 F (36.5 C)  TempSrc: Oral  SpO2: 95%  Weight: 150 lb 8 oz (68.3 kg)  Height: 5' 8"  (1.727 m)    Body mass index is 22.88 kg/m.  Physical Exam  Constitutional: Patient appears well-developed and well-nourished. No distress.  HEENT: head atraumatic, normocephalic, pupils equal and reactive to light,  neck supple, throat within normal limits Cardiovascular: Normal rate, regular rhythm and normal heart sounds.  No murmur heard. No BLE edema. Pulmonary/Chest: Effort normal and breath sounds normal. No respiratory distress. Abdominal: Soft.  There is no tenderness. Psychiatric: Patient has a normal mood and affect. behavior is normal. Judgment and thought content  normal.  PHQ2/9: Depression screen [facility] 2/9 09/11/2017 09/05/2016 03/02/2016 03/02/2015 08/25/2014  Decreased Interest 0 0 0 0 0  Down, Depressed, Hopeless 0 0 0 0 0  PHQ - 2 Score 0 0 0 0 0  Altered sleeping 0 - - - -  Tired, decreased energy 0 - - - -  Change in appetite 0 - - - -  Feeling bad or failure about yourself  0 - - - -  Trouble concentrating 0 - - - -  Moving slowly or fidgety/restless 0 - - - -  Suicidal thoughts 0 - - - -  PHQ-9 Score 0 - - - -  Difficult doing work/chores Not difficult at all - - - -     Fall Risk: Fall Risk  09/11/2017 03/12/2017 09/05/2016 03/02/2016 03/02/2015  Falls in the past year? No No No No No  Risk for fall due to : Impaired vision;Medication side effect;Other (Comment) - - - -  Risk for fall due to: Comment wears eyeglasses; dementia - - - -     Assessment & Plan  1. Benign essential HTN  - amLODipine (NORVASC) 5 MG tablet; Take 1 tablet (5 mg total) by mouth daily.  Dispense: 90 tablet; Refill: 1 - CBC with Differential/Platelet - COMPLETE METABOLIC PANEL WITH GFR  2. Elevated C-reactive protein  - C-reactive protein  3. Dyslipidemia  - atorvastatin (LIPITOR) 40 MG tablet; take 1 tablet by mouth once daily AT 6 P.M.  Dispense: 90 tablet; Refill: 1 - Lipid panel  4. Dementia, frontotemporal  - donepezil (ARICEPT) 10 MG tablet; Take 1 tablet (10 mg total) by mouth daily.  Dispense: 90 tablet; Refill: 1  5. Crohn's disease of large intestine without complication (HCC)  - C-reactive protein  6. History of iron deficiency anemia   7. Vitamin D deficiency  - VITAMIN D 25 Hydroxy (Vit-D Deficiency, Fractures)  8. Chronic kidney disease, stage III (moderate) (HCC)  - Parathyroid hormone, intact (no Ca)  9. Hereditary essential tremor  stable  10. Lack of appetite  - mirtazapine (REMERON) 15 MG tablet; Take 1 tablet (15 mg total) by mouth at bedtime.  Dispense: 90 tablet; Refill: 1

## 2017-09-11 NOTE — Patient Instructions (Signed)
[name] , Thank you for taking time to come for your Medicare Wellness Visit. I appreciate your ongoing commitment to your health goals. Please review the following plan we discussed and let me know if I can assist you in the future.   Screening recommendations/referrals: Colorectal Screening: No longer required Mammogram: No longer required Bone Density: No longer required  Vision and Dental Exams: Recommended annual ophthalmology exams for early detection of glaucoma and other disorders of the eye Recommended annual dental exams for proper oral hygiene  Vaccinations: Influenza vaccine: Completed today Pneumococcal vaccine: Up to date Tdap vaccine: Up to date Shingles vaccine: Please call your insurance company to determine your out of pocket expense for the Shingrix vaccine. You may receive this vaccine at your local pharmacy.  Advanced directives: We have received a copy of your POA (Power of Lane) and/or Living Will. These documents can be located in your chart.  Goals: Recommend to drink at least 6-8 8oz glasses of water per day.  Next appointment: Please schedule your Annual Wellness Visit with your Nurse Health Advisor in one year.  Preventive Care 78 Years and Older, Female Preventive care refers to lifestyle choices and visits with your health care provider that can promote health and wellness. What does preventive care include?  A yearly physical exam. This is also called an annual well check.  Dental exams once or twice a year.  Routine eye exams. Ask your health care provider how often you should have your eyes checked.  Personal lifestyle choices, including:  Daily care of your teeth and gums.  Regular physical activity.  Eating a healthy diet.  Avoiding tobacco and drug use.  Limiting alcohol use.  Practicing safe sex.  Taking low-dose aspirin every day.  Taking vitamin and mineral supplements as recommended by your health care provider. What  happens during an annual well check? The services and screenings done by your health care provider during your annual well check will depend on your age, overall health, lifestyle risk factors, and family history of disease. Counseling  Your health care provider may ask you questions about your:  Alcohol use.  Tobacco use.  Drug use.  Emotional well-being.  Home and relationship well-being.  Sexual activity.  Eating habits.  History of falls.  Memory and ability to understand (cognition).  Work and work Statistician.  Reproductive health. Screening  You may have the following tests or measurements:  Height, weight, and BMI.  Blood pressure.  Lipid and cholesterol levels. These may be checked every 5 years, or more frequently if you are over 30 years old.  Skin check.  Lung cancer screening. You may have this screening every year starting at age 45 if you have a 30-pack-year history of smoking and currently smoke or have quit within the past 15 years.  Fecal occult blood test (FOBT) of the stool. You may have this test every year starting at age 66.  Flexible sigmoidoscopy or colonoscopy. You may have a sigmoidoscopy every 5 years or a colonoscopy every 10 years starting at age 83.  Hepatitis C blood test.  Hepatitis B blood test.  Sexually transmitted disease (STD) testing.  Diabetes screening. This is done by checking your blood sugar (glucose) after you have not eaten for a while (fasting). You may have this done every 1-3 years.  Bone density scan. This is done to screen for osteoporosis. You may have this done starting at age 11.  Mammogram. This may be done every 1-2 years. Talk  to your health care provider about how often you should have regular mammograms. Talk with your health care provider about your test results, treatment options, and if necessary, the need for more tests. Vaccines  Your health care provider may recommend certain vaccines, such  as:  Influenza vaccine. This is recommended every year.  Tetanus, diphtheria, and acellular pertussis (Tdap, Td) vaccine. You may need a Td booster every 10 years.  Zoster vaccine. You may need this after age 51.  Pneumococcal 13-valent conjugate (PCV13) vaccine. One dose is recommended after age 86.  Pneumococcal polysaccharide (PPSV23) vaccine. One dose is recommended after age 54. Talk to your health care provider about which screenings and vaccines you need and how often you need them. This information is not intended to replace advice given to you by your health care provider. Make sure you discuss any questions you have with your health care provider. Document Released: 01/15/2015 Document Revised: 09/08/2015 Document Reviewed: 10/20/2014 Elsevier Interactive Patient Education  2017 [place] Prevention in the Home Falls can cause injuries. They can happen to people of all ages. There are many things you can do to make your home safe and to help prevent falls. What can I do on the outside of my home?  Regularly fix the edges of walkways and driveways and fix any cracks.  Remove anything that might make you trip as you walk through a door, such as a raised step or threshold.  Trim any bushes or trees on the path to your home.  Use bright outdoor lighting.  Clear any walking paths of anything that might make someone trip, such as rocks or tools.  Regularly check to see if handrails are loose or broken. Make sure that both sides of any steps have handrails.  Any raised decks and porches should have guardrails on the edges.  Have any leaves, snow, or ice cleared regularly.  Use sand or salt on walking paths during winter.  Clean up any spills in your garage right away. This includes oil or grease spills. What can I do in the bathroom?  Use night lights.  Install grab bars by the toilet and in the tub and shower. Do not use towel bars as grab bars.  Use  non-skid mats or decals in the tub or shower.  If you need to sit down in the shower, use a plastic, non-slip stool.  Keep the floor dry. Clean up any water that spills on the floor as soon as it happens.  Remove soap buildup in the tub or shower regularly.  Attach bath mats securely with double-sided non-slip rug tape.  Do not have throw rugs and other things on the floor that can make you trip. What can I do in the bedroom?  Use night lights.  Make sure that you have a light by your bed that is easy to reach.  Do not use any sheets or blankets that are too big for your bed. They should not hang down onto the floor.  Have a firm chair that has side arms. You can use this for support while you get dressed.  Do not have throw rugs and other things on the floor that can make you trip. What can I do in the kitchen?  Clean up any spills right away.  Avoid walking on wet floors.  Keep items that you use a lot in easy-to-reach places.  If you need to reach something above you, use a strong step stool that  has a grab bar.  Keep electrical cords out of the way.  Do not use floor polish or wax that makes floors slippery. If you must use wax, use non-skid floor wax.  Do not have throw rugs and other things on the floor that can make you trip. What can I do with my stairs?  Do not leave any items on the stairs.  Make sure that there are handrails on both sides of the stairs and use them. Fix handrails that are broken or loose. Make sure that handrails are as long as the stairways.  Check any carpeting to make sure that it is firmly attached to the stairs. Fix any carpet that is loose or worn.  Avoid having throw rugs at the top or bottom of the stairs. If you do have throw rugs, attach them to the floor with carpet tape.  Make sure that you have a light switch at the top of the stairs and the bottom of the stairs. If you do not have them, ask someone to add them for you. What  else can I do to help prevent falls?  Wear shoes that:  Do not have high heels.  Have rubber bottoms.  Are comfortable and fit you well.  Are closed at the toe. Do not wear sandals.  If you use a stepladder:  Make sure that it is fully opened. Do not climb a closed stepladder.  Make sure that both sides of the stepladder are locked into place.  Ask someone to hold it for you, if possible.  Clearly mark and make sure that you can see:  Any grab bars or handrails.  First and last steps.  Where the edge of each step is.  Use tools that help you move around (mobility aids) if they are needed. These include:  Canes.  Walkers.  Scooters.  Crutches.  Turn on the lights when you go into a dark area. Replace any light bulbs as soon as they burn out.  Set up your furniture so you have a clear path. Avoid moving your furniture around.  If any of your floors are uneven, fix them.  If there are any pets around you, be aware of where they are.  Review your medicines with your doctor. Some medicines can make you feel dizzy. This can increase your chance of falling. Ask your doctor what other things that you can do to help prevent falls. This information is not intended to replace advice given to you by your health care provider. Make sure you discuss any questions you have with your health care provider. Document Released: 10/15/2008 Document Revised: 05/27/2015 Document Reviewed: 01/23/2014 Elsevier Interactive Patient Education  2017 [employer].

## 2017-09-12 ENCOUNTER — Ambulatory Visit: Payer: Medicare Other | Admitting: Family Medicine

## 2017-09-12 LAB — COMPLETE METABOLIC PANEL WITH GFR
AG RATIO: 1.1 (calc) (ref 1.0–2.5)
ALBUMIN MSPROF: 4 g/dL (ref 3.6–5.1)
ALKALINE PHOSPHATASE (APISO): 98 U/L (ref 33–130)
ALT: 8 U/L (ref 6–29)
AST: 18 U/L (ref 10–35)
BUN / CREAT RATIO: 18 (calc) (ref 6–22)
BUN: 23 mg/dL (ref 7–25)
CHLORIDE: 103 mmol/L (ref 98–110)
CO2: 26 mmol/L (ref 20–32)
Calcium: 10 mg/dL (ref 8.6–10.4)
Creat: 1.29 mg/dL — ABNORMAL HIGH (ref 0.60–0.88)
GFR, EST AFRICAN AMERICAN: 40 mL/min/{1.73_m2} — AB (ref >=60)
GFR, Est Non African American: 35 mL/min/{1.73_m2} — ABNORMAL LOW (ref >=60)
Globulin: 3.7 g/dL (calc) (ref 1.9–3.7)
Glucose, Bld: 97 mg/dL (ref 65–99)
POTASSIUM: 3.4 mmol/L — AB (ref 3.5–5.3)
SODIUM: 142 mmol/L (ref 135–146)
TOTAL PROTEIN: 7.7 g/dL (ref 6.1–8.1)
Total Bilirubin: 0.5 mg/dL (ref 0.2–1.2)

## 2017-09-12 LAB — VITAMIN D 25 HYDROXY (VIT D DEFICIENCY, FRACTURES): VIT D 25 HYDROXY: 62 ng/mL (ref 30–100)

## 2017-09-12 LAB — CBC WITH DIFFERENTIAL/PLATELET
BASOS ABS: 90 {cells}/uL (ref 0–200)
BASOS PCT: 1 %
EOS ABS: 90 {cells}/uL (ref 15–500)
Eosinophils Relative: 1 %
HCT: 39.2 % (ref 35.0–45.0)
HEMOGLOBIN: 13.2 g/dL (ref 11.7–15.5)
Lymphs Abs: 2421 cells/uL (ref 850–3900)
MCH: 29.4 pg (ref 27.0–33.0)
MCHC: 33.7 g/dL (ref 32.0–36.0)
MCV: 87.3 fL (ref 80.0–100.0)
MONOS PCT: 8.4 %
MPV: 11.5 fL (ref 7.5–12.5)
Neutro Abs: 5643 cells/uL (ref 1500–7800)
Neutrophils Relative %: 62.7 %
PLATELETS: 320 10*3/uL (ref 140–400)
RBC: 4.49 10*6/uL (ref 3.80–5.10)
RDW: 13.4 % (ref 11.0–15.0)
TOTAL LYMPHOCYTE: 26.9 %
WBC: 9 10*3/uL (ref 3.8–10.8)
WBCMIX: 756 {cells}/uL (ref 200–950)

## 2017-09-12 LAB — LIPID PANEL
CHOLESTEROL: 177 mg/dL (ref <200)
HDL: 58 mg/dL (ref >50)
LDL Cholesterol (Calc): 98 mg/dL (calc)
Non-HDL Cholesterol (Calc): 119 mg/dL (calc) (ref <130)
Total CHOL/HDL Ratio: 3.1 (calc) (ref <5.0)
Triglycerides: 116 mg/dL (ref <150)

## 2017-09-12 LAB — C-REACTIVE PROTEIN: CRP: 7.9 mg/L (ref <8.0)

## 2017-09-12 LAB — PARATHYROID HORMONE, INTACT (NO CA): PTH: 90 pg/mL — AB (ref 14–64)

## 2017-10-19 ENCOUNTER — Other Ambulatory Visit: Payer: Self-pay | Admitting: Family Medicine

## 2017-10-19 DIAGNOSIS — R630 Anorexia: Secondary | ICD-10-CM

## 2017-11-07 ENCOUNTER — Other Ambulatory Visit: Payer: Self-pay | Admitting: Family Medicine

## 2017-11-07 DIAGNOSIS — F0280 Dementia in other diseases classified elsewhere without behavioral disturbance: Secondary | ICD-10-CM

## 2017-11-07 DIAGNOSIS — G3109 Other frontotemporal dementia: Principal | ICD-10-CM

## 2017-11-19 ENCOUNTER — Other Ambulatory Visit: Payer: Self-pay | Admitting: Family Medicine

## 2017-11-19 DIAGNOSIS — E785 Hyperlipidemia, unspecified: Secondary | ICD-10-CM

## 2018-01-21 ENCOUNTER — Other Ambulatory Visit: Payer: Self-pay | Admitting: Family Medicine

## 2018-01-21 DIAGNOSIS — R630 Anorexia: Secondary | ICD-10-CM

## 2018-01-21 NOTE — Telephone Encounter (Signed)
Refill request for general medication. Mirtazapine to [facility].  Last office visit 09/11/2017   Follow up on 03/13/2018 ,

## 2018-03-10 ENCOUNTER — Other Ambulatory Visit: Payer: Self-pay | Admitting: Family Medicine

## 2018-03-10 DIAGNOSIS — I10 Essential (primary) hypertension: Secondary | ICD-10-CM

## 2018-03-13 ENCOUNTER — Encounter: Payer: Self-pay | Admitting: Family Medicine

## 2018-03-13 ENCOUNTER — Ambulatory Visit (INDEPENDENT_AMBULATORY_CARE_PROVIDER_SITE_OTHER): Payer: Medicare Other | Admitting: Family Medicine

## 2018-03-13 VITALS — BP 140/80 | HR 85 | Temp 97.6°F | Resp 16 | Ht 68.0 in | Wt 144.8 lb

## 2018-03-13 DIAGNOSIS — Z862 Personal history of diseases of the blood and blood-forming organs and certain disorders involving the immune mechanism: Secondary | ICD-10-CM

## 2018-03-13 DIAGNOSIS — K5010 Crohn's disease of large intestine without complications: Secondary | ICD-10-CM

## 2018-03-13 DIAGNOSIS — I10 Essential (primary) hypertension: Secondary | ICD-10-CM | POA: Diagnosis not present

## 2018-03-13 DIAGNOSIS — N2581 Secondary hyperparathyroidism of renal origin: Secondary | ICD-10-CM

## 2018-03-13 DIAGNOSIS — G3109 Other frontotemporal dementia: Secondary | ICD-10-CM

## 2018-03-13 DIAGNOSIS — N183 Chronic kidney disease, stage 3 unspecified: Secondary | ICD-10-CM

## 2018-03-13 DIAGNOSIS — G250 Essential tremor: Secondary | ICD-10-CM | POA: Diagnosis not present

## 2018-03-13 DIAGNOSIS — I129 Hypertensive chronic kidney disease with stage 1 through stage 4 chronic kidney disease, or unspecified chronic kidney disease: Secondary | ICD-10-CM | POA: Insufficient documentation

## 2018-03-13 DIAGNOSIS — R630 Anorexia: Secondary | ICD-10-CM

## 2018-03-13 DIAGNOSIS — F0280 Dementia in other diseases classified elsewhere without behavioral disturbance: Secondary | ICD-10-CM

## 2018-03-13 DIAGNOSIS — E785 Hyperlipidemia, unspecified: Secondary | ICD-10-CM

## 2018-03-13 MED ORDER — AMLODIPINE BESYLATE 5 MG PO TABS: 5.0000 mg | ORAL_TABLET | Freq: Every day | ORAL | 0 refills | Status: DC

## 2018-03-13 MED ORDER — DONEPEZIL HCL 10 MG PO TABS: 10.0000 mg | ORAL_TABLET | Freq: Every day | ORAL | 1 refills | Status: DC

## 2018-03-13 MED ORDER — MIRTAZAPINE 15 MG PO TABS: 15.0000 mg | ORAL_TABLET | Freq: Every day | ORAL | 1 refills | Status: DC

## 2018-03-13 MED ORDER — ATORVASTATIN CALCIUM 40 MG PO TABS: ORAL_TABLET | ORAL | 1 refills | Status: DC

## 2018-03-13 MED ORDER — ATENOLOL-CHLORTHALIDONE 50-25 MG PO TABS: 0.5000 | ORAL_TABLET | ORAL | 3 refills | Status: DC

## 2018-03-13 NOTE — Patient Instructions (Signed)
Hypokalemia Hypokalemia means that the amount of potassium in the blood is lower than normal.Potassium is a chemical that helps regulate the amount of fluid in the body (electrolyte). It also stimulates muscle tightening (contraction) and helps nerves work properly.Normally, most of the body's potassium is inside of cells, and only a very small amount is in the blood. Because the amount in the blood is so small, minor changes to potassium levels in the blood can be life-threatening. What are the causes? This condition may be caused by:  Antibiotic medicine.  Diarrhea or vomiting. Taking too much of a medicine that helps you have a bowel movement (laxative) can cause diarrhea and lead to hypokalemia.  Chronic kidney disease (CKD).  Medicines that help the body get rid of excess fluid (diuretics).  Eating disorders, such as bulimia.  Low magnesium levels in the body.  Sweating a lot. What are the signs or symptoms? Symptoms of this condition include:  Weakness.  Constipation.  Fatigue.  Muscle cramps.  Mental confusion.  Skipped heartbeats or irregular heartbeat (palpitations).  Tingling or numbness. How is this diagnosed? This condition is diagnosed with a blood test. How is this treated? Hypokalemia can be treated by taking potassium supplements by mouth or adjusting the medicines that you take. Treatment may also include eating more foods that contain a lot of potassium. If your potassium level is very low, you may need to get potassium through an IV tube in one of your veins and be monitored in the hospital. Follow these instructions at home:   Take over-the-counter and prescription medicines only as told by your health care provider. This includes vitamins and supplements.  Eat a healthy diet. A healthy diet includes fresh fruits and vegetables, whole grains, healthy fats, and lean proteins.  If instructed, eat more foods that contain a lot of potassium, such  as: ? Nuts, such as peanuts and pistachios. ? Seeds, such as sunflower seeds and pumpkin seeds. ? Peas, lentils, and lima beans. ? Whole grain and bran cereals and breads. ? Fresh fruits and vegetables, such as apricots, avocado, bananas, cantaloupe, kiwi, oranges, tomatoes, asparagus, and potatoes. ? Orange juice. ? Tomato juice. ? Red meats. ? Yogurt.  Keep all follow-up visits as told by your health care provider. This is important. Contact a health care provider if:  You have weakness that gets worse.  You feel your heart pounding or racing.  You vomit.  You have diarrhea.  You have diabetes (diabetes mellitus) and you have trouble keeping your blood sugar (glucose) in your target range. Get help right away if:  You have chest pain.  You have shortness of breath.  You have vomiting or diarrhea that lasts for more than 2 days.  You faint. This information is not intended to replace advice given to you by your health care provider. Make sure you discuss any questions you have with your health care provider. Document Released: 12/19/2004 Document Revised: 08/07/2015 Document Reviewed: 08/07/2015 Elsevier Interactive Patient Education  2019 [employer].

## 2018-03-13 NOTE — Progress Notes (Signed)
Name: Meredith Wagner   MRN: [id number]    DOB: 11/04/1917   Date:03/13/2018       Progress Note  Subjective  Chief Complaint  Chief Complaint  Patient presents with  . Hypertension  . Hyperglycemia  . Hyperlipidemia    HPI   HTN:  no dizziness, no chest pain or palpitation, denies side effects of medication.BP was high when she first arrived but back to goal after rest. Continue medication.   Hyperlipidemia: taking Atorvastatin and denies myalgias,  Reviewed labs and doing well.   Frontotemporal dementia: she is stable, appetite is stable- daughter states eats better when she sits down with her.She liives with her daughter, normal behavior, cooperative, she is able to shower,clothes herself,eating independently even with tremors, prepares breakfast. Daughter dispenses her medication and takes care of the house. Lives with her daughter   CKI stage III with secondary hyperparathyroidism.:  denies pruritus, or edema. She has normal urine output, she does not want to see nephrologist. Daughter is here with her..  Crohn's disease: doing well, sees GI every 6 months, no recent abdominal pain, diarrhea or rectal bleeding, taking medication as prescribed - Asacol . Reviewed last labs no anemia and CRP was back to normal    Patient Active Problem List   Diagnosis Date Noted  . Hyperglycemia 09/07/2016  . Chronic kidney disease (CKD), stage IV (severe) ([place]) 09/05/2016  . Hearing loss 08/25/2014  . 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 Chronic kidney disease, stage III (moderate) ([place]) 08/22/2014  . Crohn's disease of large bowel ([place]) 08/22/2014  . Hereditary essential tremor 08/22/2014  . History of iron deficiency anemia 08/22/2014  . OP (osteoporosis) 08/22/2014  . Dementia, frontotemporal ([place]) 06/28/2009  . Vitamin D deficiency 09/28/2008  . 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 Benign essential HTN 06/15/2006  . Dyslipidemia 06/15/2006    Past Surgical  History:  Procedure Laterality Date  . ABDOMINAL HYSTERECTOMY  09/07/2009    History reviewed. No pertinent family history.  Social History   Socioeconomic History  . Marital status: Divorced    Spouse name: Not on file  . Number of children: 4  . Years of education: Not on file  . Highest education level: 11th grade  Occupational History    Employer: RETIRED  Social Needs  . Financial resource strain: Not hard at all  . Food insecurity:    Worry: Never true    Inability: Never true  . Transportation needs:    Medical: No    Non-medical: No  Tobacco Use  . Smoking status: Never Smoker  . Smokeless tobacco: Never Used  . Tobacco comment: smoking cessation materials not required  Substance and Sexual Activity  . Alcohol use: No    Alcohol/week: 0.0 standard drinks  . Drug use: No  . Sexual activity: Not Currently  Lifestyle  . Physical activity:    Days per week: 0 days    Minutes per session: 0 min  . Stress: Not at all  Relationships  . Social connections:    Talks on phone: Patient refused    Gets together: Patient refused    Attends religious service: Patient refused    Active member of club or organization: Patient refused    Attends meetings of clubs or organizations: Patient refused    Relationship status: Separated  . Intimate partner violence:    Fear of current or ex partner: No    Emotionally abused: No    Physically abused: No    Forced sexual  activity: No  Other Topics Concern  . Not on file  Social History Narrative   Lives with her daughter.      Current Outpatient Medications:  .  amLODipine (NORVASC) 5 MG tablet, TAKE 1 TABLET(5 MG) BY MOUTH DAILY, Disp: 90 tablet, Rfl: 0 .  atenolol-chlorthalidone (TENORETIC) 50-25 MG tablet, Take 0.5 tablets by mouth every morning., Disp: 45 tablet, Rfl: 3 .  atorvastatin (LIPITOR) 40 MG tablet, take 1 tablet by mouth once daily AT 6 P.M., Disp: 90 tablet, Rfl: 1 .  donepezil (ARICEPT) 10 MG tablet, TAKE 1  TABLET BY MOUTH ONCE DAILY, Disp: 90 tablet, Rfl: 1 .  Mesalamine 800 MG TBEC, Take 2 tablets by mouth 2 (two) times daily., Disp: , Rfl:  .  mirtazapine (REMERON) 15 MG tablet, TAKE 1 TABLET BY MOUTH AT BEDTIME, Disp: 90 tablet, Rfl: 0 .  RA VITAMIN D-3 2000 units CAPS, take 1 capsule by mouth once daily, Disp: 30 capsule, Rfl: 12  Allergies  Allergen Reactions  . 2,4-D Dimethylamine (Amisol) Swelling  . Ace Inhibitors     Other reaction(s): Angioedema  . Niacin Er   . Penicillins     I personally reviewed active problem list, medication list, allergies, family history, social history with the patient/caregiver today.   ROS  Constitutional: Negative for fever , positive for some  weight change, but only 4 lbs since last year.  Respiratory: Negative for cough and shortness of breath.   Cardiovascular: Negative for chest pain or palpitations.  Gastrointestinal: Negative for abdominal pain, no bowel changes.  Musculoskeletal: Negative for gait problem or joint swelling.  Skin: Negative for rash.  Neurological: Negative for dizziness or headache.  No other specific complaints in a complete review of systems (except as listed in HPI above).  Objective  Vitals:   03/13/18 0803 03/13/18 0817  BP: (!) 190/80 140/80  Pulse: 85   Resp: 16   Temp: 97.6 F (36.4 C)   TempSrc: Oral   SpO2: 96%   Weight: 144 lb 12.8 oz (65.7 kg)   Height: 5' 8"  (1.727 m)     Body mass index is 22.02 kg/m.  Physical Exam  Constitutional: Patient appears well-developed and well-nourished.No distress.  HEENT: head atraumatic, normocephalic, pupils equal and reactive to light, neck supple, throat within normal limits Cardiovascular: Normal rate, regular rhythm and normal heart sounds.  No murmur heard. No BLE edema. Pulmonary/Chest: Effort normal and breath sounds normal. No respiratory distress. Abdominal: Soft.  There is no tenderness. Psychiatric: Patient has a normal mood and affect. behavior  is normal. Judgment and thought content normal.   PHQ2/9: Depression screen [facility] 2/9 03/13/2018 09/11/2017 09/05/2016 03/02/2016 03/02/2015  Decreased Interest 0 0 0 0 0  Down, Depressed, Hopeless 0 0 0 0 0  PHQ - 2 Score 0 0 0 0 0  Altered sleeping 0 0 - - -  Tired, decreased energy 0 0 - - -  Change in appetite 0 0 - - -  Feeling bad or failure about yourself  0 0 - - -  Trouble concentrating 0 0 - - -  Moving slowly or fidgety/restless 0 0 - - -  Suicidal thoughts 0 0 - - -  PHQ-9 Score 0 0 - - -  Difficult doing work/chores - Not difficult at all - - -     Fall Risk: Fall Risk  03/13/2018 09/11/2017 03/12/2017 09/05/2016 03/02/2016  Falls in the past year? 0 No No No No  Number falls in  past yr: 0 - - - -  Injury with Fall? 0 - - - -  Risk for fall due to : - Impaired vision;Medication side effect;Other (Comment) - - -  Risk for fall due to: Comment - wears eyeglasses; dementia - - -     Assessment & Plan  1. Benign essential HTN  - amLODipine (NORVASC) 5 MG tablet; Take 1 tablet (5 mg total) by mouth daily.  Dispense: 90 tablet; Refill: 0 - atenolol-chlorthalidone (TENORETIC) 50-25 MG tablet; Take 0.5 tablets by mouth every morning.  Dispense: 45 tablet; Refill: 3  2. Secondary renal hyperparathyroidism ([place])   3. Crohn's disease of large intestine without complication ([place])   4. Hereditary essential tremor   5. Chronic kidney disease, stage III (moderate) ([facility])  We will continue to monitor   6. History of iron deficiency anemia  Reviewed labs  7. Benign hypertension with CKD (chronic kidney disease) stage III ([facility])  Doing well   8. Dyslipidemia  - atorvastatin (LIPITOR) 40 MG tablet; take 1 tablet by mouth once daily AT 6 P.M.  Dispense: 90 tablet; Refill: 1  9. Dementia, frontotemporal ([place])  - donepezil (ARICEPT) 10 MG tablet; Take 1 tablet (10 mg total) by mouth daily.  Dispense: 90 tablet; Refill: 1  10. Lack of appetite  - mirtazapine (REMERON) 15 MG  tablet; Take 1 tablet (15 mg total) by mouth at bedtime.  Dispense: 90 tablet; Refill: 1

## 2018-05-06 ENCOUNTER — Other Ambulatory Visit: Payer: Self-pay | Admitting: Family Medicine

## 2018-05-06 DIAGNOSIS — G3109 Other frontotemporal dementia: Principal | ICD-10-CM

## 2018-05-06 DIAGNOSIS — F0280 Dementia in other diseases classified elsewhere without behavioral disturbance: Secondary | ICD-10-CM

## 2018-05-06 NOTE — Telephone Encounter (Signed)
Hypertension medication request:  Donepezil to [facility].    Last office visit pertaining to hypertension: 03/13/2018   BP Readings from Last 3 Encounters:  03/13/18 140/80  09/11/17 (!) 146/68  09/11/17 (!) 146/68    Lab Results  Component Value Date   CREATININE 1.29 (H) 09/11/2017   BUN 23 09/11/2017   NA 142 09/11/2017   K 3.4 (L) 09/11/2017   CL 103 09/11/2017   CO2 26 09/11/2017    Follow up 09/17/2018

## 2018-05-28 ENCOUNTER — Other Ambulatory Visit: Payer: Self-pay | Admitting: Family Medicine

## 2018-05-28 DIAGNOSIS — I10 Essential (primary) hypertension: Secondary | ICD-10-CM

## 2018-06-25 ENCOUNTER — Other Ambulatory Visit: Payer: Self-pay | Admitting: Family Medicine

## 2018-06-25 DIAGNOSIS — E559 Vitamin D deficiency, unspecified: Secondary | ICD-10-CM

## 2018-06-26 ENCOUNTER — Other Ambulatory Visit: Payer: Self-pay | Admitting: Family Medicine

## 2018-06-26 DIAGNOSIS — I10 Essential (primary) hypertension: Secondary | ICD-10-CM

## 2018-07-04 ENCOUNTER — Other Ambulatory Visit: Payer: Self-pay | Admitting: Family Medicine

## 2018-07-04 DIAGNOSIS — F0280 Dementia in other diseases classified elsewhere without behavioral disturbance: Secondary | ICD-10-CM

## 2018-08-12 ENCOUNTER — Other Ambulatory Visit: Payer: Self-pay | Admitting: Family Medicine

## 2018-08-12 DIAGNOSIS — R630 Anorexia: Secondary | ICD-10-CM

## 2018-09-17 ENCOUNTER — Ambulatory Visit (INDEPENDENT_AMBULATORY_CARE_PROVIDER_SITE_OTHER): Payer: Medicare Other | Admitting: Family Medicine

## 2018-09-17 ENCOUNTER — Encounter: Payer: Self-pay | Admitting: Family Medicine

## 2018-09-17 ENCOUNTER — Other Ambulatory Visit: Payer: Self-pay

## 2018-09-17 ENCOUNTER — Ambulatory Visit (INDEPENDENT_AMBULATORY_CARE_PROVIDER_SITE_OTHER): Payer: Medicare Other

## 2018-09-17 VITALS — BP 130/64 | HR 75 | Temp 96.2°F | Resp 16 | Ht 68.0 in | Wt 141.8 lb

## 2018-09-17 DIAGNOSIS — Z23 Encounter for immunization: Secondary | ICD-10-CM

## 2018-09-17 DIAGNOSIS — I129 Hypertensive chronic kidney disease with stage 1 through stage 4 chronic kidney disease, or unspecified chronic kidney disease: Secondary | ICD-10-CM

## 2018-09-17 DIAGNOSIS — N183 Chronic kidney disease, stage 3 unspecified: Secondary | ICD-10-CM

## 2018-09-17 DIAGNOSIS — Z0000 Encounter for general adult medical examination without abnormal findings: Secondary | ICD-10-CM | POA: Diagnosis not present

## 2018-09-17 DIAGNOSIS — R630 Anorexia: Secondary | ICD-10-CM

## 2018-09-17 DIAGNOSIS — E559 Vitamin D deficiency, unspecified: Secondary | ICD-10-CM

## 2018-09-17 DIAGNOSIS — G3109 Other frontotemporal dementia: Secondary | ICD-10-CM | POA: Diagnosis not present

## 2018-09-17 DIAGNOSIS — I10 Essential (primary) hypertension: Secondary | ICD-10-CM

## 2018-09-17 DIAGNOSIS — G250 Essential tremor: Secondary | ICD-10-CM

## 2018-09-17 DIAGNOSIS — E785 Hyperlipidemia, unspecified: Secondary | ICD-10-CM

## 2018-09-17 DIAGNOSIS — K5010 Crohn's disease of large intestine without complications: Secondary | ICD-10-CM

## 2018-09-17 DIAGNOSIS — N2581 Secondary hyperparathyroidism of renal origin: Secondary | ICD-10-CM

## 2018-09-17 DIAGNOSIS — E441 Mild protein-calorie malnutrition: Secondary | ICD-10-CM

## 2018-09-17 DIAGNOSIS — F0280 Dementia in other diseases classified elsewhere without behavioral disturbance: Secondary | ICD-10-CM

## 2018-09-17 MED ORDER — ATORVASTATIN CALCIUM 40 MG PO TABS: ORAL_TABLET | ORAL | 1 refills | Status: DC

## 2018-09-17 MED ORDER — DONEPEZIL HCL 10 MG PO TABS: 10.0000 mg | ORAL_TABLET | Freq: Every day | ORAL | 1 refills | Status: DC

## 2018-09-17 MED ORDER — MIRTAZAPINE 15 MG PO TABS: 15.0000 mg | ORAL_TABLET | Freq: Every day | ORAL | 1 refills | Status: DC

## 2018-09-17 MED ORDER — AMLODIPINE BESYLATE 5 MG PO TABS: 5.0000 mg | ORAL_TABLET | Freq: Every day | ORAL | 1 refills | Status: DC

## 2018-09-17 NOTE — Progress Notes (Addendum)
Subjective:   Meredith Wagner is a 85 y.o. female who presents for Medicare Annual (Subsequent) preventive examination.  Review of Systems:   Cardiac Risk Factors include: advanced age (>41mn, >>36women);hypertension;dyslipidemia     Objective:     Vitals: BP 130/64   Pulse 75   Temp (!) 96.2 F (35.7 C) (Temporal)   Resp 16   Ht 5' 8"  (1.727 m)   Wt 141 lb 12.8 oz (64.3 kg)   SpO2 98%   BMI 21.56 kg/m   Body mass index is 21.56 kg/m.  Advanced Directives 09/17/2018 09/11/2017 09/05/2016 03/02/2016 03/02/2015 08/25/2014  Does Patient Have a Medical Advance Directive? Yes Yes No Yes Yes Yes  Type of [place]aramed[place]of [place]av[place] will [place] will - Healthcare Power of [place] CityLiving will [place] Does patient want to make changes to medical advance directive? - - - - No - Patient declined -  Copy of HFidelityin Chart? Yes - validated most recent copy scanned in chart (See row information) Yes - Yes No - copy requested No - copy requested    Tobacco Social History   Tobacco Use  Smoking Status Never Smoker  Smokeless Tobacco Never Used  Tobacco Comment   smoking cessation materials not required     Counseling given: Not Answered Comment: smoking cessation materials not required   Clinical Intake:  Pre-visit preparation completed: Yes  Pain : No/denies pain     BMI - recorded: 21.56 Nutritional Status: BMI of 19-24  Normal Nutritional Risks: None Diabetes: No  How often do you need to have someone help you when you read instructions, pamphlets, or other written materials from your doctor or pharmacy?: 1 - Never  Interpreter Needed?: No  Information entered by :: KClemetine MarkerLPN  Past Medical History:  Diagnosis Date  . ACE inhibitor-aggravated angioedema   . Anemia   . Appetite lost   . Crohn's disease ([place]   . Familial tremor   .  Frontotemporal dementia ([place]   . Hyperlipidemia   . Hypertension   . Hypokalemia   . Low kidney function   . Osteoporosis   . Vitamin D deficiency    Past Surgical History:  Procedure Laterality Date  . ABDOMINAL HYSTERECTOMY  09/07/2009   History reviewed. No pertinent family history. Social History   Socioeconomic History  . Marital status: Divorced    Spouse name: Not on file  . Number of children: 4  . Years of education: Not on file  . Highest education level: 11th grade  Occupational History    Employer: RETIRED  Social Needs  . Financial resource strain: Not hard at all  . Food insecurity    Worry: Never true    Inability: Never true  . Transportation needs    Medical: No    Non-medical: No  Tobacco Use  . Smoking status: Never Smoker  . Smokeless tobacco: Never Used  . Tobacco comment: smoking cessation materials not required  Substance and Sexual Activity  . Alcohol use: No    Alcohol/week: 0.0 standard drinks  . Drug use: No  . Sexual activity: Not Currently  Lifestyle  . Physical activity    Days per week: 0 days    Minutes per session: 0 min  . Stress: Not at all  Relationships  . Social [place] phone: Patient refused    Gets together: Patient  refused    Attends religious service: Patient refused    Active member of club or organization: Patient refused    Attends meetings of clubs or organizations: Patient refused    Relationship status: Divorced  Other Topics Concern  . Not on file  Social History Narrative   Lives with her daughter.     Outpatient Encounter Medications as of 09/17/2018  Medication Sig  . amLODipine (NORVASC) 5 MG tablet TAKE 1 TABLET(5 MG) BY MOUTH DAILY  . atenolol-chlorthalidone (TENORETIC) 50-25 MG tablet Take 0.5 tablets by mouth every morning.  Marland Kitchen atorvastatin (LIPITOR) 40 MG tablet take 1 tablet by mouth once daily AT 6 P.M.  . Cholecalciferol (VITAMIN D3) 50 MCG (2000 UT) capsule TAKE 1 CAPSULE BY  MOUTH ONCE DAILY  . donepezil (ARICEPT) 10 MG tablet TAKE 1 TABLET BY MOUTH ONCE DAILY  . Mesalamine 800 MG TBEC Take 2 tablets by mouth 2 (two) times daily.  . mirtazapine (REMERON) 15 MG tablet TAKE 1 TABLET(15 MG) BY MOUTH AT BEDTIME   No facility-administered encounter medications on file as of 09/17/2018.     Activities of Daily Living In your present state of health, do you have any difficulty performing the following activities: 09/17/2018 09/17/2018  Hearing? Y Y  Comment declines hearing aids -  Vision? N N  Difficulty concentrating or making decisions? Tempie Donning  Walking or climbing stairs? N N  Dressing or bathing? N N  Doing errands, shopping? N N  Preparing Food and eating ? Y -  Using the Toilet? N -  In the past six months, have you accidently leaked urine? N -  Do you have problems with loss of bowel control? N -  Managing your Medications? Y -  Managing your Finances? Y -  Housekeeping or managing your Housekeeping? N -  Some recent data might be hidden    Patient Care Team: Steele Sizer, MD as PCP - General (Family Medicine) Bertram Gala as Physician Assistant (Gastroenterology)    Assessment:   This is a routine wellness examination for Lorra.  Exercise Activities and Dietary recommendations Current Exercise Habits: The patient does not participate in regular exercise at present, Exercise limited by: None identified  Goals    . DIET - INCREASE WATER INTAKE     Recommend to drink at least 6-8 8oz glasses of water per day.       Fall Risk Fall Risk  09/17/2018 03/13/2018 09/11/2017 03/12/2017 09/05/2016  Falls in the past year? 0 0 No No No  Number falls in past yr: 0 0 - - -  Injury with Fall? 0 0 - - -  Risk for fall due to : - - Impaired vision;Medication side effect;Other (Comment) - -  Risk for fall due to: Comment - - wears eyeglasses; dementia - -   FALL RISK PREVENTION PERTAINING TO THE HOME:  Any stairs in or around the home? No  If so,  do they handrails? No   Home free of loose throw rugs in walkways, pet beds, electrical cords, etc? Yes  Adequate lighting in your home to reduce risk of falls? Yes   ASSISTIVE DEVICES UTILIZED TO PREVENT FALLS:  Life alert? No  Use of a cane, walker or w/c? No  Grab bars in the bathroom? Yes  Shower chair or bench in shower? Yes  Elevated toilet seat or a handicapped toilet? No   DME ORDERS:  DME order needed?  No   TIMED UP AND GO:  Was the test performed? Yes .  Length of time to ambulate 10 feet: 7 sec.   GAIT:  Appearance of gait: Gait stead-fast and without the use of an assistive device.   Education: Fall risk prevention has been discussed.  Intervention(s) required? No   Depression Screen PHQ 2/9 Scores 09/17/2018 03/13/2018 09/11/2017 09/05/2016  PHQ - 2 Score 0 0 0 0  PHQ- 9 Score 0 0 0 -     Cognitive Function     6CIT Screen 09/17/2018 09/11/2017  What Year? 4 points 0 points  What month? 3 points 0 points  What time? 0 points 0 points  Count back from 20 2 points 0 points  Months in reverse 4 points 0 points  Repeat phrase 10 points 0 points  Total Score 23 0    Immunization History  Administered Date(s) Administered  . Fluad Quad(high Dose 65+) 09/17/2018  . Influenza, High Dose Seasonal PF 09/05/2016, 09/11/2017  . Influenza,inj,Quad PF,6+ Mos 08/25/2014  . Influenza-Unspecified 02/24/2014  . Pneumococcal Conjugate-13 02/14/2013  . Pneumococcal Polysaccharide-23 10/24/2011  . Tdap 06/20/2010  . Zoster 08/19/2007    Qualifies for Shingles Vaccine? Yes  Zostavax completed 2009. Due for Shingrix. Education has been provided regarding the importance of this vaccine. Pt has been advised to call insurance company to determine out of pocket expense. Advised may also receive vaccine at local pharmacy or Health Dept. Verbalized acceptance and understanding.  Tdap: Up to date  Flu Vaccine: Up to date  Pneumococcal Vaccine: Up to date   Screening  Tests Health Maintenance  Topic Date Due  . INFLUENZA VACCINE  08/03/2018  . TETANUS/TDAP  06/19/2020  . DEXA SCAN  Completed  . PNA vac Low Risk Adult  Completed    Cancer Screenings:  Colorectal Screening: No longer required.   Mammogram:  No longer required.   Bone Density: No longer required  Lung Cancer Screening: (Low Dose CT Chest recommended if Age 28-80 years, 30 pack-year currently smoking OR have quit w/in 15years.) does not qualify.    Additional Screening:  Hepatitis C Screening: no longer required  Vision Screening: Recommended annual ophthalmology exams for early detection of glaucoma and other disorders of the eye. Is the patient up to date with their annual eye exam?  No  Who is the provider or what is the name of the office in which the pt attends annual eye exams? Dr. Ellin Mayhew  Dental Screening: Recommended annual dental exams for proper oral hygiene  Community Resource Referral:  CRR required this visit?  No      Plan:     I have personally reviewed and addressed the Medicare Annual Wellness questionnaire and have noted the following in the patient's chart:  A. Medical and social history B. Use of alcohol, tobacco or illicit drugs  C. Current medications and supplements D. Functional ability and status E.  Nutritional status F.  Physical activity G. Advance directives H. List of other physicians I.  Hospitalizations, surgeries, and ER visits in previous 12 months J.  [place] such as hearing and vision if needed, cognitive and depression L. Referrals and appointments   In addition, I have reviewed and discussed with patient certain preventive protocols, quality metrics, and best practice recommendations. A written personalized care plan for preventive services as well as general preventive health recommendations were provided to patient.   Signed,  Clemetine Marker, LPN Nurse Health Advisor   Nurse Notes: pt accompanied to visit by  her daughter, difficulty  hearing but overall doing well. Same day appt with Dr. Ancil Boozer today.

## 2018-09-17 NOTE — Patient Instructions (Signed)
Meredith Wagner , Thank you for taking time to come for your Medicare Wellness Visit. I appreciate your ongoing commitment to your health goals. Please review the following plan we discussed and let me know if I can assist you in the future.   Screening recommendations/referrals: Colonoscopy: no longer required Mammogram: no longer required Bone Density: no longer required Recommended yearly ophthalmology/optometry visit for glaucoma screening and checkup Recommended yearly dental visit for hygiene and checkup  Vaccinations: Influenza vaccine: done today Pneumococcal vaccine: done 02/14/13 Tdap vaccine: done 06/20/10 Shingles vaccine: Shingrix discussed. Please contact your pharmacy for coverage information.   Conditions/risks identified: recommend drinking 6-8 glasses of water per day  Next appointment: Please follow up in one year for your Medicare Annual Wellness visit.     Preventive Care 35 Years and Older, Female Preventive care refers to lifestyle choices and visits with your health care provider that can promote health and wellness. What does preventive care include?  A yearly physical exam. This is also called an annual well check.  Dental exams once or twice a year.  Routine eye exams. Ask your health care provider how often you should have your eyes checked.  Personal lifestyle choices, including:  Daily care of your teeth and gums.  Regular physical activity.  Eating a healthy diet.  Avoiding tobacco and drug use.  Limiting alcohol use.  Practicing safe sex.  Taking low-dose aspirin every day.  Taking vitamin and mineral supplements as recommended by your health care provider. What happens during an annual well check? The services and screenings done by your health care provider during your annual well check will depend on your age, overall health, lifestyle risk factors, and family history of disease. Counseling  Your health care provider may ask you  questions about your:  Alcohol use.  Tobacco use.  Drug use.  Emotional well-being.  Home and relationship well-being.  Sexual activity.  Eating habits.  History of falls.  Memory and ability to understand (cognition).  Work and work Statistician.  Reproductive health. Screening  You may have the following tests or measurements:  Height, weight, and BMI.  Blood pressure.  Lipid and cholesterol levels. These may be checked every 5 years, or more frequently if you are over 27 years old.  Skin check.  Lung cancer screening. You may have this screening every year starting at age 69 if you have a 30-pack-year history of smoking and currently smoke or have quit within the past 15 years.  Fecal occult blood test (FOBT) of the stool. You may have this test every year starting at age 8.  Flexible sigmoidoscopy or colonoscopy. You may have a sigmoidoscopy every 5 years or a colonoscopy every 10 years starting at age 3.  Hepatitis C blood test.  Hepatitis B blood test.  Sexually transmitted disease (STD) testing.  Diabetes screening. This is done by checking your blood sugar (glucose) after you have not eaten for a while (fasting). You may have this done every 1-3 years.  Bone density scan. This is done to screen for osteoporosis. You may have this done starting at age 53.  Mammogram. This may be done every 1-2 years. Talk to your health care provider about how often you should have regular mammograms. Talk with your health care provider about your test results, treatment options, and if necessary, the need for more tests. Vaccines  Your health care provider may recommend certain vaccines, such as:  Influenza vaccine. This is recommended every year.  Tetanus, diphtheria,  and acellular pertussis (Tdap, Td) vaccine. You may need a Td booster every 10 years.  Zoster vaccine. You may need this after age 40.  Pneumococcal 13-valent conjugate (PCV13) vaccine. One dose is  recommended after age 58.  Pneumococcal polysaccharide (PPSV23) vaccine. One dose is recommended after age 47. Talk to your health care provider about which screenings and vaccines you need and how often you need them. This information is not intended to replace advice given to you by your health care provider. Make sure you discuss any questions you have with your health care provider. Document Released: 01/15/2015 Document Revised: 09/08/2015 Document Reviewed: 10/20/2014 Elsevier Interactive Patient Education  2017 [place] Prevention in the Home Falls can cause injuries. They can happen to people of all ages. There are many things you can do to make your home safe and to help prevent falls. What can I do on the outside of my home?  Regularly fix the edges of walkways and driveways and fix any cracks.  Remove anything that might make you trip as you walk through a door, such as a raised step or threshold.  Trim any bushes or trees on the path to your home.  Use bright outdoor lighting.  Clear any walking paths of anything that might make someone trip, such as rocks or tools.  Regularly check to see if handrails are loose or broken. Make sure that both sides of any steps have handrails.  Any raised decks and porches should have guardrails on the edges.  Have any leaves, snow, or ice cleared regularly.  Use sand or salt on walking paths during winter.  Clean up any spills in your garage right away. This includes oil or grease spills. What can I do in the bathroom?  Use night lights.  Install grab bars by the toilet and in the tub and shower. Do not use towel bars as grab bars.  Use non-skid mats or decals in the tub or shower.  If you need to sit down in the shower, use a plastic, non-slip stool.  Keep the floor dry. Clean up any water that spills on the floor as soon as it happens.  Remove soap buildup in the tub or shower regularly.  Attach bath mats  securely with double-sided non-slip rug tape.  Do not have throw rugs and other things on the floor that can make you trip. What can I do in the bedroom?  Use night lights.  Make sure that you have a light by your bed that is easy to reach.  Do not use any sheets or blankets that are too big for your bed. They should not hang down onto the floor.  Have a firm chair that has side arms. You can use this for support while you get dressed.  Do not have throw rugs and other things on the floor that can make you trip. What can I do in the kitchen?  Clean up any spills right away.  Avoid walking on wet floors.  Keep items that you use a lot in easy-to-reach places.  If you need to reach something above you, use a strong step stool that has a grab bar.  Keep electrical cords out of the way.  Do not use floor polish or wax that makes floors slippery. If you must use wax, use non-skid floor wax.  Do not have throw rugs and other things on the floor that can make you trip. What can I do with my  stairs?  Do not leave any items on the stairs.  Make sure that there are handrails on both sides of the stairs and use them. Fix handrails that are broken or loose. Make sure that handrails are as long as the stairways.  Check any carpeting to make sure that it is firmly attached to the stairs. Fix any carpet that is loose or worn.  Avoid having throw rugs at the top or bottom of the stairs. If you do have throw rugs, attach them to the floor with carpet tape.  Make sure that you have a light switch at the top of the stairs and the bottom of the stairs. If you do not have them, ask someone to add them for you. What else can I do to help prevent falls?  Wear shoes that:  Do not have high heels.  Have rubber bottoms.  Are comfortable and fit you well.  Are closed at the toe. Do not wear sandals.  If you use a stepladder:  Make sure that it is fully opened. Do not climb a closed  stepladder.  Make sure that both sides of the stepladder are locked into place.  Ask someone to hold it for you, if possible.  Clearly mark and make sure that you can see:  Any grab bars or handrails.  First and last steps.  Where the edge of each step is.  Use tools that help you move around (mobility aids) if they are needed. These include:  Canes.  Walkers.  Scooters.  Crutches.  Turn on the lights when you go into a dark area. Replace any light bulbs as soon as they burn out.  Set up your furniture so you have a clear path. Avoid moving your furniture around.  If any of your floors are uneven, fix them.  If there are any pets around you, be aware of where they are.  Review your medicines with your doctor. Some medicines can make you feel dizzy. This can increase your chance of falling. Ask your doctor what other things that you can do to help prevent falls. This information is not intended to replace advice given to you by your health care provider. Make sure you discuss any questions you have with your health care provider. Document Released: 10/15/2008 Document Revised: 05/27/2015 Document Reviewed: 01/23/2014 Elsevier Interactive Patient Education  2017 [employer].

## 2018-09-17 NOTE — Progress Notes (Signed)
Name: Meredith Wagner   MRN: [id number]    DOB: Nov 03, 1917   Date:09/17/2018       Progress Note  Subjective  Chief Complaint  Chief Complaint  Patient presents with  . Hypertension  . Dyslipidemia  . Dementia  . Osteoporosis    HPI  HTN:  no dizziness, no chest pain or palpitation, denies side effects of medication.BP is at goal today   Hyperlipidemia: taking Atorvastatin and denies myalgias,  recheck labs   Frontotemporal dementia: she is stable, appetite is stable- daughter states eats better when she sits down with her.She liives with her daughter, normal behavior, cooperative, she is able to shower,gets to feed herself,eating independently even with tremors, prepares breakfast. Daughter dispenses her medication and takes care of the house. Lives with her daughter and two great-grandsons.   CKI stage III with secondary hyperparathyroidism.:  denies pruritus, or edema. She has normal urine output, she does not want to see nephrologist. Daughter is here with her. Unchanged, we will recheck labs today   Crohn's disease: doing well, sees GI every 6 months, no recent abdominal pain, diarrhea or rectal bleeding, taking medication as prescribed - Asacol . Reviewed last labs no anemia and CRP was back to normal , we will not repeat it today   Malnutrition: she lost another 9 lbs in the past 6 months, we will try protein shake supplementation, also advised to go for rides and get ice cream  Patient Active Problem List   Diagnosis Date Noted  . Benign hypertension with CKD (chronic kidney disease) stage III ([place]) 03/13/2018  . Secondary renal hyperparathyroidism ([place]) 03/13/2018  . Hyperglycemia 09/07/2016  . Chronic kidney disease (CKD), stage IV (severe) ([place]) 09/05/2016  . Hearing loss 08/25/2014  . 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 Chronic kidney disease, stage III (moderate) ([place]) 08/22/2014  . Crohn's disease of large bowel ([place]) 08/22/2014  .  Hereditary essential tremor 08/22/2014  . History of iron deficiency anemia 08/22/2014  . OP (osteoporosis) 08/22/2014  . Dementia, frontotemporal ([place]) 06/28/2009  . Vitamin D deficiency 09/28/2008  . 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 Benign essential HTN 06/15/2006  . Dyslipidemia 06/15/2006    Past Surgical History:  Procedure Laterality Date  . ABDOMINAL HYSTERECTOMY  09/07/2009    History reviewed. No pertinent family history.  Social History   Socioeconomic History  . Marital status: Divorced    Spouse name: Not on file  . Number of children: 4  . Years of education: Not on file  . Highest education level: 11th grade  Occupational History    Employer: RETIRED  Social Needs  . Financial resource strain: Not hard at all  . Food insecurity    Worry: Never true    Inability: Never true  . Transportation needs    Medical: No    Non-medical: No  Tobacco Use  . Smoking status: Never Smoker  . Smokeless tobacco: Never Used  . Tobacco comment: smoking cessation materials not required  Substance and Sexual Activity  . Alcohol use: No    Alcohol/week: 0.0 standard drinks  . Drug use: No  . Sexual activity: Not Currently  Lifestyle  . Physical activity    Days per week: 0 days    Minutes per session: 0 min  . Stress: Not at all  Relationships  . Social connections    Talks on phone: Patient refused    Gets together: Patient refused    Attends religious service: Patient refused    Active member  of club or organization: Patient refused    Attends meetings of clubs or organizations: Patient refused    Relationship status: Divorced  . Intimate partner violence    Fear of current or ex partner: No    Emotionally abused: No    Physically abused: No    Forced sexual activity: No  Other Topics Concern  . Not on file  Social History Narrative   Lives with her daughter.      Current Outpatient Medications:  .  amLODipine (NORVASC) 5 MG tablet, TAKE 1 TABLET(5 MG) BY  MOUTH DAILY, Disp: 90 tablet, Rfl: 0 .  atenolol-chlorthalidone (TENORETIC) 50-25 MG tablet, Take 0.5 tablets by mouth every morning., Disp: 45 tablet, Rfl: 3 .  atorvastatin (LIPITOR) 40 MG tablet, take 1 tablet by mouth once daily AT 6 P.M., Disp: 90 tablet, Rfl: 1 .  Cholecalciferol (VITAMIN D3) 50 MCG (2000 UT) capsule, TAKE 1 CAPSULE BY MOUTH ONCE DAILY, Disp: 30 capsule, Rfl: 12 .  donepezil (ARICEPT) 10 MG tablet, TAKE 1 TABLET BY MOUTH ONCE DAILY, Disp: 90 tablet, Rfl: 1 .  Mesalamine 800 MG TBEC, Take 2 tablets by mouth 2 (two) times daily., Disp: , Rfl:  .  mirtazapine (REMERON) 15 MG tablet, TAKE 1 TABLET(15 MG) BY MOUTH AT BEDTIME, Disp: 90 tablet, Rfl: 0  Allergies  Allergen Reactions  . 2,4-D Dimethylamine (Amisol) Swelling  . Ace Inhibitors     Other reaction(s): Angioedema  . Niacin Er   . Penicillins     I personally reviewed active problem list, medication list, allergies, family history, social history, health maintenance with the patient/caregiver today.   ROS  Constitutional: Negative for fever positive for  weight change.  Respiratory: Negative for cough and shortness of breath.   Cardiovascular: Negative for chest pain or palpitations.  Gastrointestinal: Negative for abdominal pain, no bowel changes.  Musculoskeletal:Negative for gait problem or joint swelling.  Skin: Negative for rash.  Neurological: Negative for dizziness or headache.  No other specific complaints in a complete review of systems (except as listed in HPI above).   Objective  Vitals:   09/17/18 0822  BP: 130/64  Pulse: 75  Resp: 16  Temp: (!) 96.2 F (35.7 C)  TempSrc: Temporal  SpO2: 98%  Weight: 141 lb 12.8 oz (64.3 kg)  Height: 5' 8"  (1.727 m)    Body mass index is 21.56 kg/m.  Physical Exam  Constitutional: Patient appears well-developed and well-nourished.  No distress.  HEENT: head atraumatic, normocephalic, pupils equal and reactive to light Cardiovascular: Normal  rate, regular rhythm and normal heart sounds.  No murmur heard. No BLE edema. Pulmonary/Chest: Effort normal and breath sounds normal. No respiratory distress. Abdominal: Soft.  There is no tenderness. Psychiatric: Patient has a normal mood and affect. behavior is normal.    PHQ2/9: Depression screen [facility] 2/9 09/17/2018 03/13/2018 09/11/2017 09/05/2016 03/02/2016  Decreased Interest 0 0 0 0 0  Down, Depressed, Hopeless 0 0 0 0 0  PHQ - 2 Score 0 0 0 0 0  Altered sleeping 0 0 0 - -  Tired, decreased energy 0 0 0 - -  Change in appetite 0 0 0 - -  Feeling bad or failure about yourself  0 0 0 - -  Trouble concentrating 0 0 0 - -  Moving slowly or fidgety/restless 0 0 0 - -  Suicidal thoughts 0 0 0 - -  PHQ-9 Score 0 0 0 - -  Difficult doing work/chores - - Not difficult  at all - -    phq 9 is negative   Fall Risk: Fall Risk  09/17/2018 03/13/2018 09/11/2017 03/12/2017 09/05/2016  Falls in the past year? 0 0 No No No  Number falls in past yr: 0 0 - - -  Injury with Fall? 0 0 - - -  Risk for fall due to : - - Impaired vision;Medication side effect;Other (Comment) - -  Risk for fall due to: Comment - - wears eyeglasses; dementia - -     Functional Status Survey: Is the patient deaf or have difficulty hearing?: Yes Does the patient have difficulty seeing, even when wearing glasses/contacts?: No Does the patient have difficulty concentrating, remembering, or making decisions?: Yes Does the patient have difficulty walking or climbing stairs?: No Does the patient have difficulty dressing or bathing?: No Does the patient have difficulty doing errands alone such as visiting a doctor's office or shopping?: No    Assessment & Plan  1. Need for immunization against influenza  - Flu Vaccine QUAD High Dose(Fluad)  2. Benign essential HTN  - amLODipine (NORVASC) 5 MG tablet; Take 1 tablet (5 mg total) by mouth daily.  Dispense: 90 tablet; Refill: 1  3. Chronic kidney disease, stage III  (moderate) (H[facility])  - COMPLETE METABOLIC PANEL WITH GFR - CBC with Differential/Platelet - Parathyroid hormone, intact (no Ca)  4. Dementia, frontotemporal ([place])  - donepezil (ARICEPT) 10 MG tablet; Take 1 tablet (10 mg total) by mouth daily.  Dispense: 90 tablet; Refill: 1  5. Vitamin D deficiency   6. Benign hypertension with CKD (chronic kidney disease) stage III (HCC)  Check labs   7. Secondary renal hyperparathyroidism ([place])   8. Crohn's disease of large intestine without complication ([place])  Doing well at this time  9. Hereditary essential tremor   10. Dyslipidemia  - Lipid panel - atorvastatin (LIPITOR) 40 MG tablet; take 1 tablet by mouth once daily AT 6 P.M.  Dispense: 90 tablet; Refill: 1  11. Lack of appetite  - mirtazapine (REMERON) 15 MG tablet; Take 1 tablet (15 mg total) by mouth at bedtime.  Dispense: 90 tablet; Refill: 1  12. Mild malnutrition ([place])  Discussed protein shake supplementation

## 2018-09-18 LAB — COMPLETE METABOLIC PANEL WITH GFR
AG Ratio: 1.2 (calc) (ref 1.0–2.5)
ALT: 7 U/L (ref 6–29)
AST: 13 U/L (ref 10–35)
Albumin: 4.1 g/dL (ref 3.6–5.1)
Alkaline phosphatase (APISO): 82 U/L (ref 37–153)
BUN/Creatinine Ratio: 17 (calc) (ref 6–22)
BUN: 23 mg/dL (ref 7–25)
CO2: 29 mmol/L (ref 20–32)
Calcium: 10 mg/dL (ref 8.6–10.4)
Chloride: 103 mmol/L (ref 98–110)
Creat: 1.39 mg/dL — ABNORMAL HIGH (ref 0.60–0.88)
GFR, Est African American: 36 mL/min/{1.73_m2} — ABNORMAL LOW (ref >=60)
GFR, Est Non African American: 31 mL/min/{1.73_m2} — ABNORMAL LOW (ref >=60)
Globulin: 3.3 g/dL (calc) (ref 1.9–3.7)
Glucose, Bld: 107 mg/dL — ABNORMAL HIGH (ref 65–99)
Potassium: 3.9 mmol/L (ref 3.5–5.3)
Sodium: 142 mmol/L (ref 135–146)
Total Bilirubin: 0.6 mg/dL (ref 0.2–1.2)
Total Protein: 7.4 g/dL (ref 6.1–8.1)

## 2018-09-18 LAB — CBC WITH DIFFERENTIAL/PLATELET
Absolute Monocytes: 686 cells/uL (ref 200–950)
Basophils Absolute: 88 cells/uL (ref 0–200)
Basophils Relative: 1 %
Eosinophils Absolute: 123 cells/uL (ref 15–500)
Eosinophils Relative: 1.4 %
HCT: 37.8 % (ref 35.0–45.0)
Hemoglobin: 12.5 g/dL (ref 11.7–15.5)
Lymphs Abs: 2631 cells/uL (ref 850–3900)
MCH: 29.3 pg (ref 27.0–33.0)
MCHC: 33.1 g/dL (ref 32.0–36.0)
MCV: 88.7 fL (ref 80.0–100.0)
MPV: 11.2 fL (ref 7.5–12.5)
Monocytes Relative: 7.8 %
Neutro Abs: 5271 cells/uL (ref 1500–7800)
Neutrophils Relative %: 59.9 %
Platelets: 305 10*3/uL (ref 140–400)
RBC: 4.26 10*6/uL (ref 3.80–5.10)
RDW: 13.5 % (ref 11.0–15.0)
Total Lymphocyte: 29.9 %
WBC: 8.8 10*3/uL (ref 3.8–10.8)

## 2018-09-18 LAB — LIPID PANEL
Cholesterol: 168 mg/dL (ref <200)
HDL: 54 mg/dL (ref >=50)
LDL Cholesterol (Calc): 92 mg/dL (calc)
Non-HDL Cholesterol (Calc): 114 mg/dL (calc) (ref <130)
Total CHOL/HDL Ratio: 3.1 (calc) (ref <5.0)
Triglycerides: 123 mg/dL (ref <150)

## 2018-09-18 LAB — PARATHYROID HORMONE, INTACT (NO CA): PTH: 79 pg/mL — ABNORMAL HIGH (ref 14–64)

## 2018-11-11 ENCOUNTER — Other Ambulatory Visit: Payer: Self-pay | Admitting: Family Medicine

## 2018-11-11 DIAGNOSIS — R630 Anorexia: Secondary | ICD-10-CM

## 2019-02-10 ENCOUNTER — Ambulatory Visit: Payer: Medicare Other | Attending: Internal Medicine

## 2019-02-10 ENCOUNTER — Other Ambulatory Visit: Payer: Self-pay

## 2019-02-10 DIAGNOSIS — Z23 Encounter for immunization: Secondary | ICD-10-CM | POA: Insufficient documentation

## 2019-02-10 NOTE — Progress Notes (Signed)
   Covid-19 Vaccination Clinic  Name:  RAJEAN DESANTIAGO    MRN: [id number] DOB: 01-01-1918  02/10/2019  Ms. Roa was observed post Covid-19 immunization for 15 minutes without incidence. She was provided with Vaccine Information Sheet and instruction to access the V-Safe system.   Ms. Woodhead was instructed to call 911 with any severe reactions post vaccine: Marland Kitchen Difficulty breathing  . Swelling of your face and throat  . A fast heartbeat  . A bad rash all over your body  . Dizziness and weakness    Immunizations Administered    Name Date Dose VIS Date Route   Moderna COVID-19 Vaccine 02/10/2019  1:34 PM 0.5 mL 12/03/2018 Intramuscular   Manufacturer: Moderna   Lot: [id number]   [place]: [id number]

## 2019-03-03 ENCOUNTER — Other Ambulatory Visit: Payer: Self-pay | Admitting: Family Medicine

## 2019-03-03 DIAGNOSIS — E785 Hyperlipidemia, unspecified: Secondary | ICD-10-CM

## 2019-03-12 ENCOUNTER — Ambulatory Visit: Payer: Medicare Other | Attending: Internal Medicine

## 2019-03-12 DIAGNOSIS — Z23 Encounter for immunization: Secondary | ICD-10-CM | POA: Insufficient documentation

## 2019-03-12 NOTE — Progress Notes (Signed)
   Covid-19 Vaccination Clinic  Name:  Meredith Wagner    MRN: [id number] DOB: 10/07/1917  03/12/2019  [name] was observed post Covid-19 immunization for 15 minutes without incident. She was provided with Vaccine Information Sheet and instruction to access the V-Safe system.   [name] was instructed to call 911 with any severe reactions post vaccine: Marland Kitchen Difficulty breathing  . Swelling of face and throat  . A fast heartbeat  . A bad rash all over body  . Dizziness and weakness   Immunizations Administered    Name Date Dose VIS Date Route   Moderna COVID-19 Vaccine 03/12/2019  1:25 PM 0.5 mL 12/03/2018 Intramuscular   Manufacturer: Moderna   Lot: [id number]   [place]: [id number]

## 2019-03-18 ENCOUNTER — Ambulatory Visit: Payer: Medicare Other | Admitting: Family Medicine

## 2019-04-02 ENCOUNTER — Other Ambulatory Visit: Payer: Self-pay

## 2019-04-02 ENCOUNTER — Ambulatory Visit (INDEPENDENT_AMBULATORY_CARE_PROVIDER_SITE_OTHER): Payer: Medicare Other | Admitting: Family Medicine

## 2019-04-02 ENCOUNTER — Encounter: Payer: Self-pay | Admitting: Family Medicine

## 2019-04-02 VITALS — BP 130/80 | HR 94 | Temp 96.6°F | Resp 16 | Ht 68.0 in | Wt 119.8 lb

## 2019-04-02 DIAGNOSIS — E440 Moderate protein-calorie malnutrition: Secondary | ICD-10-CM

## 2019-04-02 DIAGNOSIS — E441 Mild protein-calorie malnutrition: Secondary | ICD-10-CM | POA: Insufficient documentation

## 2019-04-02 DIAGNOSIS — N184 Chronic kidney disease, stage 4 (severe): Secondary | ICD-10-CM

## 2019-04-02 DIAGNOSIS — I10 Essential (primary) hypertension: Secondary | ICD-10-CM | POA: Diagnosis not present

## 2019-04-02 DIAGNOSIS — N2581 Secondary hyperparathyroidism of renal origin: Secondary | ICD-10-CM

## 2019-04-02 DIAGNOSIS — R627 Adult failure to thrive: Secondary | ICD-10-CM

## 2019-04-02 DIAGNOSIS — F0280 Dementia in other diseases classified elsewhere without behavioral disturbance: Secondary | ICD-10-CM

## 2019-04-02 DIAGNOSIS — R630 Anorexia: Secondary | ICD-10-CM

## 2019-04-02 DIAGNOSIS — K5010 Crohn's disease of large intestine without complications: Secondary | ICD-10-CM | POA: Diagnosis not present

## 2019-04-02 DIAGNOSIS — G3109 Other frontotemporal dementia: Secondary | ICD-10-CM

## 2019-04-02 MED ORDER — DONEPEZIL HCL 10 MG PO TABS: 10.0000 mg | ORAL_TABLET | Freq: Every day | ORAL | 1 refills | Status: DC

## 2019-04-02 MED ORDER — MIRTAZAPINE 15 MG PO TABS: 15.0000 mg | ORAL_TABLET | Freq: Every day | ORAL | 1 refills | Status: DC

## 2019-04-02 NOTE — Progress Notes (Signed)
Name: Meredith Wagner   MRN: [id number]    DOB: 09-17-1917   Date:04/02/2019       Progress Note  Subjective  Chief Complaint  Chief Complaint  Patient presents with  . Medication Refill    6 month F/U  . Hypertension    Denies any symptoms  . Dyslipidemia  . Malnutrition    Eating 3 meals a day and daughter giving her two servings of ice cream a day-has no appetite   . Crohn's Disease  . CKI stage III with secondary hyperparathyroidism  . Frontotemporal dementia    HPI  HTN: no dizziness, no chest pain or palpitation, denies side effects of medication.BP is at goal today , daughter states she has not taken her medications this morning yet. Explained that her bp could be dropping at home, advised to check bp at home a few times in the afternoon and if below 130/80 we will decrease dose of norvasc to avoid falls  Hyperlipidemia: taking Atorvastatin and denies myalgias,she is not having any problems and wants to stay on current medications  Frontotemporal dementia: she is stable except for lack of appetite, normal behavior, cooperative, she is able to shower,gets to feed herself,eating independently even with tremors, prepares breakfast. Daughter dispenses her medication and takes care of the house.Lives with her daughterand two great-grandsons.   CKIstage III with secondary hyperparathyroidism.: denies pruritus, or edema. She has normal urine output, she does not want to see nephrologist. Daughter is here with her. Discussed rechecking labs today or next visit and she chose to wait until her 6 month follow up unless any changes  Crohn's disease: doing well,  no recent abdominal pain, diarrhea or rectal bleeding, taking medication as prescribed - Asacol .  Malnutrition: she has lost 25 lbs in the past year, daughter states that recently she started to give her ice cream and cookies. Discussed a high caloric diet, knowing that high protein is not good for her kidney but  she needs to increase calories to gain weight   Failure to thrive: she lost 25 lbs in the past year.   Patient Active Problem List   Diagnosis Date Noted  . Benign hypertension with CKD (chronic kidney disease) stage III 03/13/2018  . Secondary renal hyperparathyroidism ([place]) 03/13/2018  . Hyperglycemia 09/07/2016  . Chronic kidney disease (CKD), stage IV (severe) ([place]) 09/05/2016  . Hearing loss 08/25/2014  . 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 Chronic kidney disease, stage III (moderate) ([place]) 08/22/2014  . Crohn's disease of large bowel ([place]) 08/22/2014  . Hereditary essential tremor 08/22/2014  . History of iron deficiency anemia 08/22/2014  . OP (osteoporosis) 08/22/2014  . Dementia, frontotemporal ([place]) 06/28/2009  . Vitamin D deficiency 09/28/2008  . 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 Benign essential HTN 06/15/2006  . Dyslipidemia 06/15/2006    Past Surgical History:  Procedure Laterality Date  . ABDOMINAL HYSTERECTOMY  09/07/2009    History reviewed. No pertinent family history.  Social History   Tobacco Use  . Smoking status: Never Smoker  . Smokeless tobacco: Never Used  . Tobacco comment: smoking cessation materials not required  Substance Use Topics  . Alcohol use: No    Alcohol/week: 0.0 standard drinks     Current Outpatient Medications:  .  amLODipine (NORVASC) 5 MG tablet, Take 1 tablet (5 mg total) by mouth daily., Disp: 90 tablet, Rfl: 1 .  atenolol-chlorthalidone (TENORETIC) 50-25 MG tablet, Take 0.5 tablets by mouth every morning., Disp: 45 tablet, Rfl: 3 .  atorvastatin (LIPITOR) 40 MG tablet, TAKE 1 TABLET BY MOUTH EVERY DAY AT 6 PM, Disp: 90 tablet, Rfl: 1 .  Cholecalciferol (VITAMIN D3) 50 MCG (2000 UT) capsule, TAKE 1 CAPSULE BY MOUTH ONCE DAILY, Disp: 30 capsule, Rfl: 12 .  donepezil (ARICEPT) 10 MG tablet, Take 1 tablet (10 mg total) by mouth daily., Disp: 90 tablet, Rfl: 1 .  Mesalamine 800 MG TBEC, Take 2 tablets by mouth 2 (two)  times daily., Disp: , Rfl:  .  mirtazapine (REMERON) 15 MG tablet, TAKE 1 TABLET BY MOUTH AT BEDTIME, Disp: 90 tablet, Rfl: 1  Allergies  Allergen Reactions  . 2,4-D Dimethylamine (Amisol) Swelling  . Ace Inhibitors     Other reaction(s): Angioedema  . Niacin Er   . Penicillins     I personally reviewed active problem list, medication list, allergies, family history, social history, health maintenance with the patient/caregiver today.   ROS  Constitutional: Negative for fever, positive for  weight change.  Respiratory: Negative for cough and shortness of breath.   Cardiovascular: Negative for chest pain or palpitations.  Gastrointestinal: Negative for abdominal pain, no bowel changes.  Musculoskeletal: Negative for gait problem or joint swelling.  Skin: Negative for rash.  Neurological: Negative for dizziness or headache.  No other specific complaints in a complete review of systems (except as listed in HPI above).   Objective  Vitals:   04/02/19 1029 04/02/19 1039  BP:  130/80  Pulse: 94   Resp: 16   Temp: (!) 96.6 F (35.9 C)   TempSrc: Temporal   SpO2: 97%   Weight: 119 lb 12.8 oz (54.3 kg)   Height: 5' 8"  (1.727 m)     Body mass index is 18.22 kg/m.  Physical Exam  Constitutional: Patient appears well-developed and malnourished, sunken eyes . No distress.  HEENT: head atraumatic, normocephalic, pupils equal and reactive to light Cardiovascular: Normal rate, regular rhythm and normal heart sounds.  No murmur heard. No BLE edema. Pulmonary/Chest: Effort normal and breath sounds normal. No respiratory distress. Abdominal: Soft.  There is no tenderness. Psychiatric: Patient has a normal mood and affect. behavior is normal. Judgment and thought content normal.   PHQ2/9: Depression screen [facility] 2/9 04/02/2019 09/17/2018 03/13/2018 09/11/2017 09/05/2016  Decreased Interest 0 0 0 0 0  Down, Depressed, Hopeless 0 0 0 0 0  PHQ - 2 Score 0 0 0 0 0  Altered sleeping 0 0 0  0 -  Tired, decreased energy 0 0 0 0 -  Change in appetite 0 0 0 0 -  Feeling bad or failure about yourself  0 0 0 0 -  Trouble concentrating 0 0 0 0 -  Moving slowly or fidgety/restless 0 0 0 0 -  Suicidal thoughts 0 0 0 0 -  PHQ-9 Score 0 0 0 0 -  Difficult doing work/chores Not difficult at all - - Not difficult at all -    phq 9 is negative   Fall Risk: Fall Risk  04/02/2019 09/17/2018 03/13/2018 09/11/2017 03/12/2017  Falls in the past year? 0 0 0 No No  Number falls in past yr: 0 0 0 - -  Injury with Fall? 0 0 0 - -  Risk for fall due to : - - - Impaired vision;Medication side effect;Other (Comment) -  Risk for fall due to: Comment - - - wears eyeglasses; dementia -     Functional Status Survey: Is the patient deaf or have difficulty hearing?: Yes Does the patient have  difficulty seeing, even when wearing glasses/contacts?: Yes Does the patient have difficulty concentrating, remembering, or making decisions?: Yes Does the patient have difficulty walking or climbing stairs?: No Does the patient have difficulty dressing or bathing?: No Does the patient have difficulty doing errands alone such as visiting a doctor's office or shopping?: Yes    Assessment & Plan  1. Failure to thrive syndrome, adult   2. Benign essential HTN  At goal without medication this am, we may need to go down on norvasc, her daughter will call back with bp readings at the end of this week   3. Crohn's disease of large intestine without complication ([place])  Doing well   4. Dementia, frontotemporal ([place])  - donepezil (ARICEPT) 10 MG tablet; Take 1 tablet (10 mg total) by mouth daily.  Dispense: 90 tablet; Refill: 1  5. Lack of appetite   - mirtazapine (REMERON) 15 MG tablet; Take 1 tablet (15 mg total) by mouth at bedtime.  Dispense: 90 tablet; Refill: 1  6. Moderate  malnutrition ([place])   7. Chronic kidney disease (CKD), stage IV (severe) ([place])  Recheck labs next visit   8. Secondary  renal hyperparathyroidism ([place])

## 2019-04-02 NOTE — Patient Instructions (Signed)

## 2019-04-28 ENCOUNTER — Other Ambulatory Visit: Payer: Self-pay | Admitting: Family Medicine

## 2019-04-28 DIAGNOSIS — I10 Essential (primary) hypertension: Secondary | ICD-10-CM

## 2019-04-28 NOTE — Telephone Encounter (Signed)
Requested Prescriptions  Pending Prescriptions Disp Refills  . amLODipine (NORVASC) 5 MG tablet [Pharmacy Med Name: AMLODIPINE BESYLATE 5MG TABLETS] 90 tablet 1    Sig: TAKE 1 TABLET(5 MG) BY MOUTH DAILY     Cardiovascular:  Calcium Channel Blockers Passed - 04/28/2019 11:12 AM      Passed - Last BP in normal range    BP Readings from Last 1 Encounters:  04/02/19 130/80         Passed - Valid encounter within last 6 months    Recent Outpatient Visits          3 weeks ago Failure to thrive syndrome, adult   [facility] Steele Sizer, MD   7 months ago Benign essential HTN   [facility] Steele Sizer, MD   1 year ago Benign essential HTN   [facility] Steele Sizer, MD   1 year ago Benign essential HTN   [facility] Steele Sizer, MD   2 years ago Benign essential HTN   [facility] Steele Sizer, MD      Future Appointments            In 5 months Ancil Boozer, Drue Stager, MD [facility], [facility]

## 2019-05-02 ENCOUNTER — Other Ambulatory Visit: Payer: Self-pay | Admitting: Family Medicine

## 2019-05-02 DIAGNOSIS — I10 Essential (primary) hypertension: Secondary | ICD-10-CM

## 2019-05-09 ENCOUNTER — Encounter: Payer: Self-pay | Admitting: Family Medicine

## 2019-05-21 DIAGNOSIS — R002 Palpitations: Secondary | ICD-10-CM | POA: Insufficient documentation

## 2019-06-28 ENCOUNTER — Other Ambulatory Visit: Payer: Self-pay | Admitting: Family Medicine

## 2019-06-28 DIAGNOSIS — E559 Vitamin D deficiency, unspecified: Secondary | ICD-10-CM

## 2019-06-28 NOTE — Telephone Encounter (Signed)
Requested  medications are  due for refill today yes  Requested medications are on the active medication list yes  Last refill 5/27  Last visit 2 months ago  Notes to clinic Not Delegated

## 2019-06-30 ENCOUNTER — Other Ambulatory Visit: Payer: Self-pay

## 2019-07-07 ENCOUNTER — Other Ambulatory Visit: Payer: Self-pay | Admitting: Family Medicine

## 2019-07-07 DIAGNOSIS — E559 Vitamin D deficiency, unspecified: Secondary | ICD-10-CM

## 2019-08-28 ENCOUNTER — Other Ambulatory Visit: Payer: Self-pay | Admitting: Family Medicine

## 2019-08-28 DIAGNOSIS — E785 Hyperlipidemia, unspecified: Secondary | ICD-10-CM

## 2019-08-28 NOTE — Telephone Encounter (Signed)
Requested Prescriptions  Pending Prescriptions Disp Refills  . atorvastatin (LIPITOR) 40 MG tablet [Pharmacy Med Name: ATORVASTATIN 40MG TABLETS] 90 tablet 0    Sig: TAKE 1 TABLET BY MOUTH EVERY DAY AT 6 PM     Cardiovascular:  Antilipid - Statins Passed - 08/28/2019  6:29 AM      Passed - Total Cholesterol in normal range and within 360 days    Cholesterol, Total  Date Value Ref Range Status  08/25/2014 172 100 - 199 mg/dL Final   Cholesterol  Date Value Ref Range Status  09/17/2018 168 <200 mg/dL Final         Passed - LDL in normal range and within 360 days    LDL Cholesterol (Calc)  Date Value Ref Range Status  09/17/2018 92 mg/dL (calc) Final    Comment:    Reference range: <100 . Desirable range <100 mg/dL for primary prevention;   <70 mg/dL for patients with CHD or diabetic patients  with > or = 2 CHD risk factors. Marland Kitchen LDL-C is now calculated using the Martin-Hopkins  calculation, which is a validated novel method providing  better accuracy than the Friedewald equation in the  estimation of LDL-C.  Cresenciano Genre et al. Annamaria Helling. 6962;952(84): 2061-2068  (http://education.[employer].com/faq/FAQ164)          Passed - HDL in normal range and within 360 days    HDL  Date Value Ref Range Status  09/17/2018 54 > OR = 50 mg/dL Final  08/25/2014 59 >39 mg/dL Final    Comment:    According to ATP-III Guidelines, HDL-C >59 mg/dL is considered a negative risk factor for CHD.          Passed - Triglycerides in normal range and within 360 days    Triglycerides  Date Value Ref Range Status  09/17/2018 123 <150 mg/dL Final         Passed - Patient is not pregnant      Passed - Valid encounter within last 12 months    Recent Outpatient Visits          4 months ago Failure to thrive syndrome, adult   [facility] Steele Sizer, MD   11 months ago Benign essential HTN   [facility] Steele Sizer, MD   1 year ago Benign  essential HTN   [facility] Steele Sizer, MD   1 year ago Benign essential HTN   [facility] Steele Sizer, MD   2 years ago Benign essential HTN   [facility] Steele Sizer, MD      Future Appointments            In 1 month Ancil Boozer, Drue Stager, MD [facility], [facility]

## 2019-09-01 ENCOUNTER — Telehealth: Payer: Self-pay | Admitting: Family Medicine

## 2019-09-01 NOTE — Telephone Encounter (Signed)
Copied from [place] [id number]. Topic: Medicare AWV >> Sep 01, 2019 11:08 AM Cher Nakai R wrote: Reason for CRM: Left message for patient to call back and schedule the Medicare Annual Wellness Visit (AWV) in office or virtual  Last AWV 09/17/2018  Please schedule at anytime with [place].  40 minute appointment  Any questions, please contact me at [phone]

## 2019-09-25 ENCOUNTER — Ambulatory Visit (INDEPENDENT_AMBULATORY_CARE_PROVIDER_SITE_OTHER): Payer: Medicare Other

## 2019-09-25 DIAGNOSIS — Z0000 Encounter for general adult medical examination without abnormal findings: Secondary | ICD-10-CM | POA: Diagnosis not present

## 2019-09-25 NOTE — Progress Notes (Signed)
Subjective:   Meredith Wagner is a 86 y.o. female who presents for Medicare Annual (Subsequent) preventive examination.  Virtual Visit via Telephone Note  I connected with  Meredith Wagner on 09/25/19 at  2:10 PM EDT by telephone and verified that I am speaking with the correct person using two identifiers.  Medicare Annual Wellness visit completed telephonically due to Covid-19 pandemic.   Location: Patient: home Provider: [place]   I discussed the limitations, risks, security and privacy concerns of performing an evaluation and management service by telephone and the availability of in person appointments. The patient expressed understanding and agreed to proceed.  Unable to perform video visit due to video visit attempted and failed and/or patient does not have video capability.   Some vital signs may be absent or patient reported.   Meredith Marker, LPN    Review of Systems     Cardiac Risk Factors include: advanced age (>30mn, >>6women);hypertension;dyslipidemia     Objective:    There were no vitals filed for this visit. There is no height or weight on file to calculate BMI.  Advanced Directives 09/25/2019 09/17/2018 09/11/2017 09/05/2016 03/02/2016 03/02/2015 08/25/2014  Does Patient Have a Medical Advance Directive? Yes Yes Yes No Yes Yes Yes  Type of AParamedicof [place] will [place] will [place] will - Healthcare Power of [place]Living will [place] Does patient want to make changes to medical advance directive? - - - - - No - Patient declined -  Copy of HP[place]in Chart? Yes - validated most recent copy scanned in chart (See row information) Yes - validated most recent copy scanned in chart (See row information) Yes - Yes No - copy requested No - copy requested    Current Medications (verified) Outpatient Encounter  Medications as of 09/25/2019  Medication Sig  . amLODipine (NORVASC) 5 MG tablet TAKE 1 TABLET(5 MG) BY MOUTH DAILY  . atenolol (TENORMIN) 50 MG tablet Take 50 mg by mouth daily.  .Marland Kitchenatorvastatin (LIPITOR) 40 MG tablet TAKE 1 TABLET BY MOUTH EVERY DAY AT 6 PM  . chlorthalidone (HYGROTON) 25 MG tablet Take 12.5 mg by mouth daily.  . Cholecalciferol (VITAMIN D3) 50 MCG (2000 UT) capsule TAKE 1 CAPSULE BY MOUTH ONCE DAILY  . donepezil (ARICEPT) 10 MG tablet Take 1 tablet (10 mg total) by mouth daily.  . Mesalamine 800 MG TBEC Take 2 tablets by mouth 2 (two) times daily.  . mirtazapine (REMERON) 15 MG tablet Take 1 tablet (15 mg total) by mouth at bedtime.  . [DISCONTINUED] atenolol-chlorthalidone (TENORETIC) 50-25 MG tablet TAKE 1/2 TABLET BY MOUTH EVERY MORNING   No facility-administered encounter medications on file as of 09/25/2019.    Allergies (verified) 2,4-d dimethylamine (amisol); Ace inhibitors; Niacin er; and Penicillins   History: Past Medical History:  Diagnosis Date  . ACE inhibitor-aggravated angioedema   . Anemia   . Appetite lost   . Crohn's disease ([place]   . Familial tremor   . Frontotemporal dementia ([place]   . Hyperlipidemia   . Hypertension   . Hypokalemia   . Low kidney function   . Osteoporosis   . Vitamin D deficiency    Past Surgical History:  Procedure Laterality Date  . ABDOMINAL HYSTERECTOMY  09/07/2009   History reviewed. No pertinent family history. Social History   Socioeconomic History  . Marital status: Divorced    Spouse  name: Not on file  . Number of children: 4  . Years of education: Not on file  . Highest education level: 11th grade  Occupational History    Employer: RETIRED  Tobacco Use  . Smoking status: Never Smoker  . Smokeless tobacco: Never Used  . Tobacco comment: smoking cessation materials not required  Vaping Use  . Vaping Use: Never used  Substance and Sexual Activity  . Alcohol use: No    Alcohol/week: 0.0 standard drinks   . Drug use: No  . Sexual activity: Not Currently  Other Topics Concern  . Not on file  Social History Narrative   Lives with her daughter and two grandsons    Social Determinants of Health   Financial Resource Strain: Low Risk   . Difficulty of Paying Living Expenses: Not hard at all  Food Insecurity: No Food Insecurity  . Worried About Charity fundraiser in the Last Year: Never true  . Ran Out of Food in the Last Year: Never true  Transportation Needs: No Transportation Needs  . Lack of Transportation (Medical): No  . Lack of Transportation (Non-Medical): No  Physical Activity: Inactive  . Days of Exercise per Week: 0 days  . Minutes of Exercise per Session: 0 min  Stress: No Stress Concern Present  . Feeling of Stress : Not at all  Social Connections: Unknown  . Frequency of Communication with Friends and Family: Patient refused  . Frequency of Social Gatherings with Friends and Family: Patient refused  . Attends Religious Services: Patient refused  . Active Member of Clubs or Organizations: Patient refused  . Attends Archivist Meetings: Patient refused  . Marital Status: Divorced    Tobacco Counseling Counseling given: Not Answered Comment: smoking cessation materials not required   Clinical Intake:  Pre-visit preparation completed: Yes  Pain : No/denies pain     Nutritional Risks: None Diabetes: No  How often do you need to have someone help you when you read instructions, pamphlets, or other written materials from your doctor or pharmacy?: 1 - Never    Interpreter Needed?: No  Information entered by :: Meredith Marker LPN   Activities of Daily Living In your present state of health, do you have any difficulty performing the following activities: 09/25/2019 04/02/2019  Hearing? Y Y  Comment declines hearing aids -  Vision? N Y  Difficulty concentrating or making decisions? Tempie Donning  Walking or climbing stairs? Y N  Dressing or bathing? N N  Doing  errands, shopping? N Y  Conservation officer, nature and eating ? N -  Using the Toilet? N -  In the past six months, have you accidently leaked urine? N -  Do you have problems with loss of bowel control? N -  Managing your Medications? N -  Managing your Finances? N -  Housekeeping or managing your Housekeeping? N -  Some recent data might be hidden    Patient Care Team: Steele Sizer, MD as PCP - General (Family Medicine) Ubaldo Glassing Javier Docker, MD as Consulting Physician (Cardiology) Rodney Booze as Physician Assistant  Indicate any recent Medical Services you may have received from other than [facility] providers in the past year (date may be approximate).     Assessment:   This is a routine wellness examination for Meredith Wagner.  Hearing/Vision screen  Hearing Screening   125Hz  250Hz  500Hz  1000Hz  2000Hz  3000Hz  4000Hz  6000Hz  8000Hz   Right ear:           Left  ear:           Comments: Poor hearing, needs hearing aids   Vision Screening Comments: Past due for eye exam at Dr. Ellin Mayhew  Dietary issues and exercise activities discussed: Current Exercise Habits: The patient does not participate in regular exercise at present, Exercise limited by: cardiac condition(s)  Goals    . DIET - INCREASE WATER INTAKE     Recommend to drink at least 6-8 8oz glasses of water per day.      Depression Screen PHQ 2/9 Scores 09/25/2019 04/02/2019 09/17/2018 03/13/2018 09/11/2017 09/05/2016 03/02/2016  PHQ - 2 Score 0 0 0 0 0 0 0  PHQ- 9 Score - 0 0 0 0 - -    Fall Risk Fall Risk  09/25/2019 04/02/2019 09/17/2018 03/13/2018 09/11/2017  Falls in the past year? 0 0 0 0 No  Number falls in past yr: 0 0 0 0 -  Injury with Fall? 0 0 0 0 -  Risk for fall due to : No Fall Risks - - - Impaired vision;Medication side effect;Other (Comment)  Risk for fall due to: Comment - - - - wears eyeglasses; dementia  Follow up Falls prevention discussed - - - -    Any stairs in or around the home? Yes  If so, are there any without  handrails? No  Home free of loose throw rugs in walkways, pet beds, electrical cords, etc? Yes  Adequate lighting in your home to reduce risk of falls? Yes   ASSISTIVE DEVICES UTILIZED TO PREVENT FALLS:  Life alert? No  Use of a cane, walker or w/c? No  Grab bars in the bathroom? Yes  Shower chair or bench in shower? Yes  Elevated toilet seat or a handicapped toilet? Yes   TIMED UP AND GO:  Was the test performed? No . Telephonic visit.   Cognitive Function:     6CIT Screen 09/17/2018 09/11/2017  What Year? 4 points 0 points  What month? 3 points 0 points  What time? 0 points 0 points  Count back from 20 2 points 0 points  Months in reverse 4 points 0 points  Repeat phrase 10 points 0 points  Total Score 23 0    Immunizations Immunization History  Administered Date(s) Administered  . Fluad Quad(high Dose 65+) 09/17/2018  . Influenza, High Dose Seasonal PF 09/05/2016, 09/11/2017  . Influenza,inj,Quad PF,6+ Mos 08/25/2014  . Influenza-Unspecified 02/24/2014  . Moderna SARS-COVID-2 Vaccination 02/10/2019, 03/12/2019  . Pneumococcal Conjugate-13 02/14/2013  . Pneumococcal Polysaccharide-23 10/24/2011  . Tdap 06/20/2010  . Zoster 08/19/2007    TDAP status: Up to date   Flu vaccine status: due for 2021-2022 season   Pneumococcal vaccine status: Up to date   Covid-19 vaccine status: Completed vaccines  Qualifies for Shingles Vaccine? Yes   Zostavax completed Yes   Shingrix Completed?: No.    Education has been provided regarding the importance of this vaccine. Patient has been advised to call insurance company to determine out of pocket expense if they have not yet received this vaccine. Advised may also receive vaccine at local pharmacy or Health Dept. Verbalized acceptance and understanding.  Screening Tests Health Maintenance  Topic Date Due  . INFLUENZA VACCINE  08/03/2019  . TETANUS/TDAP  06/19/2020  . DEXA SCAN  Completed  . COVID-19 Vaccine  Completed  .  PNA vac Low Risk Adult  Completed    Health Maintenance  Health Maintenance Due  Topic Date Due  . INFLUENZA VACCINE  08/03/2019  Colorectal cancer screening: No longer required.    Mammogram status: No longer required.    Bone density status: no longer required  Lung Cancer Screening: (Low Dose CT Chest recommended if Age 27-80 years, 30 pack-year currently smoking OR have quit w/in 15years.) does not qualify.   Additional Screening:  Hepatitis C Screening: does not qualify;  Vision Screening: Recommended annual ophthalmology exams for early detection of glaucoma and other disorders of the eye. Is the patient up to date with their annual eye exam?  No  Who is the provider or what is the name of the office in which the patient attends annual eye exams? Dr. Ellin Mayhew  Dental Screening: Recommended annual dental exams for proper oral hygiene  Community Resource Referral / Chronic Care Management: CRR required this visit?  No   CCM required this visit?  No      Plan:     I have personally reviewed and noted the following in the patient's chart:   . Medical and social history . Use of alcohol, tobacco or illicit drugs  . Current medications and supplements . Functional ability and status . Nutritional status . Physical activity . Advanced directives . List of other physicians . Hospitalizations, surgeries, and ER visits in previous 12 months . Vitals . Screenings to include cognitive, depression, and falls . Referrals and appointments  In addition, I have reviewed and discussed with patient certain preventive protocols, quality metrics, and best practice recommendations. A written personalized care plan for preventive services as well as general preventive health recommendations were provided to patient.     Meredith Marker, LPN   05/12/2583   Nurse Notes: pt accompanied during visit by daughter Meredith Wagner. She is overall doing well and will have follow up with Dr.  Ancil Boozer next week.

## 2019-09-25 NOTE — Patient Instructions (Signed)
Meredith Wagner , Thank you for taking time to come for your Medicare Wellness Visit. I appreciate your ongoing commitment to your health goals. Please review the following plan we discussed and let me know if I can assist you in the future.   Screening recommendations/referrals: Colonoscopy: no longer required  Mammogram: no longer required Bone Density: no longer required  Recommended yearly ophthalmology/optometry visit for glaucoma screening and checkup Recommended yearly dental visit for hygiene and checkup  Vaccinations: Influenza vaccine: due Pneumococcal vaccine: done 02/14/13 Tdap vaccine: done 06/20/10 Shingles vaccine: Shingrix discussed. Please contact your pharmacy for coverage information.  Covid-19: done 02/10/19 & 03/12/19  Conditions/risks identified: Keep up the great work!  Next appointment: Follow up in one year for your annual wellness visit    Preventive Care 84 Years and Older, Female Preventive care refers to lifestyle choices and visits with your health care provider that can promote health and wellness. What does preventive care include?  A yearly physical exam. This is also called an annual well check.  Dental exams once or twice a year.  Routine eye exams. Ask your health care provider how often you should have your eyes checked.  Personal lifestyle choices, including:  Daily care of your teeth and gums.  Regular physical activity.  Eating a healthy diet.  Avoiding tobacco and drug use.  Limiting alcohol use.  Practicing safe sex.  Taking low-dose aspirin every day.  Taking vitamin and mineral supplements as recommended by your health care provider. What happens during an annual well check? The services and screenings done by your health care provider during your annual well check will depend on your age, overall health, lifestyle risk factors, and family history of disease. Counseling  Your health care provider may ask you questions about  your:  Alcohol use.  Tobacco use.  Drug use.  Emotional well-being.  Home and relationship well-being.  Sexual activity.  Eating habits.  History of falls.  Memory and ability to understand (cognition).  Work and work Statistician.  Reproductive health. Screening  You may have the following tests or measurements:  Height, weight, and BMI.  Blood pressure.  Lipid and cholesterol levels. These may be checked every 5 years, or more frequently if you are over 84 years old.  Skin check.  Lung cancer screening. You may have this screening every year starting at age 84 if you have a 30-pack-year history of smoking and currently smoke or have quit within the past 15 years.  Fecal occult blood test (FOBT) of the stool. You may have this test every year starting at age 84.  Flexible sigmoidoscopy or colonoscopy. You may have a sigmoidoscopy every 5 years or a colonoscopy every 10 years starting at age 84.  Hepatitis C blood test.  Hepatitis B blood test.  Sexually transmitted disease (STD) testing.  Diabetes screening. This is done by checking your blood sugar (glucose) after you have not eaten for a while (fasting). You may have this done every 1-3 years.  Bone density scan. This is done to screen for osteoporosis. You may have this done starting at age 84.  Mammogram. This may be done every 1-2 years. Talk to your health care provider about how often you should have regular mammograms. Talk with your health care provider about your test results, treatment options, and if necessary, the need for more tests. Vaccines  Your health care provider may recommend certain vaccines, such as:  Influenza vaccine. This is recommended every year.  Tetanus, diphtheria,  and acellular pertussis (Tdap, Td) vaccine. You may need a Td booster every 10 years.  Zoster vaccine. You may need this after age 84.  Pneumococcal 13-valent conjugate (PCV13) vaccine. One dose is recommended  after age 84.  Pneumococcal polysaccharide (PPSV23) vaccine. One dose is recommended after age 84. Talk to your health care provider about which screenings and vaccines you need and how often you need them. This information is not intended to replace advice given to you by your health care provider. Make sure you discuss any questions you have with your health care provider. Document Released: 01/15/2015 Document Revised: 09/08/2015 Document Reviewed: 10/20/2014 Elsevier Interactive Patient Education  2017 [employer] Prevention in the Home Falls can cause injuries. They can happen to people of all ages. There are many things you can do to make your home safe and to help prevent falls. What can I do on the outside of my home?  Regularly fix the edges of walkways and driveways and fix any cracks.  Remove anything that might make you trip as you walk through a door, such as a raised step or threshold.  Trim any bushes or trees on the path to your home.  Use bright outdoor lighting.  Clear any walking paths of anything that might make someone trip, such as rocks or tools.  Regularly check to see if handrails are loose or broken. Make sure that both sides of any steps have handrails.  Any raised decks and porches should have guardrails on the edges.  Have any leaves, snow, or ice cleared regularly.  Use sand or salt on walking paths during winter.  Clean up any spills in your garage right away. This includes oil or grease spills. What can I do in the bathroom?  Use night lights.  Install grab bars by the toilet and in the tub and shower. Do not use towel bars as grab bars.  Use non-skid mats or decals in the tub or shower.  If you need to sit down in the shower, use a plastic, non-slip stool.  Keep the floor dry. Clean up any water that spills on the floor as soon as it happens.  Remove soap buildup in the tub or shower regularly.  Attach bath mats securely with  double-sided non-slip rug tape.  Do not have throw rugs and other things on the floor that can make you trip. What can I do in the bedroom?  Use night lights.  Make sure that you have a light by your bed that is easy to reach.  Do not use any sheets or blankets that are too big for your bed. They should not hang down onto the floor.  Have a firm chair that has side arms. You can use this for support while you get dressed.  Do not have throw rugs and other things on the floor that can make you trip. What can I do in the kitchen?  Clean up any spills right away.  Avoid walking on wet floors.  Keep items that you use a lot in easy-to-reach places.  If you need to reach something above you, use a strong step stool that has a grab bar.  Keep electrical cords out of the way.  Do not use floor polish or wax that makes floors slippery. If you must use wax, use non-skid floor wax.  Do not have throw rugs and other things on the floor that can make you trip. What can I do with my  stairs?  Do not leave any items on the stairs.  Make sure that there are handrails on both sides of the stairs and use them. Fix handrails that are broken or loose. Make sure that handrails are as long as the stairways.  Check any carpeting to make sure that it is firmly attached to the stairs. Fix any carpet that is loose or worn.  Avoid having throw rugs at the top or bottom of the stairs. If you do have throw rugs, attach them to the floor with carpet tape.  Make sure that you have a light switch at the top of the stairs and the bottom of the stairs. If you do not have them, ask someone to add them for you. What else can I do to help prevent falls?  Wear shoes that:  Do not have high heels.  Have rubber bottoms.  Are comfortable and fit you well.  Are closed at the toe. Do not wear sandals.  If you use a stepladder:  Make sure that it is fully opened. Do not climb a closed stepladder.  Make  sure that both sides of the stepladder are locked into place.  Ask someone to hold it for you, if possible.  Clearly mark and make sure that you can see:  Any grab bars or handrails.  First and last steps.  Where the edge of each step is.  Use tools that help you move around (mobility aids) if they are needed. These include:  Canes.  Walkers.  Scooters.  Crutches.  Turn on the lights when you go into a dark area. Replace any light bulbs as soon as they burn out.  Set up your furniture so you have a clear path. Avoid moving your furniture around.  If any of your floors are uneven, fix them.  If there are any pets around you, be aware of where they are.  Review your medicines with your doctor. Some medicines can make you feel dizzy. This can increase your chance of falling. Ask your doctor what other things that you can do to help prevent falls. This information is not intended to replace advice given to you by your health care provider. Make sure you discuss any questions you have with your health care provider. Document Released: 10/15/2008 Document Revised: 05/27/2015 Document Reviewed: 01/23/2014 Elsevier Interactive Patient Education  2017 [employer].

## 2019-09-27 ENCOUNTER — Other Ambulatory Visit: Payer: Self-pay | Admitting: Family Medicine

## 2019-09-27 DIAGNOSIS — R630 Anorexia: Secondary | ICD-10-CM

## 2019-09-27 NOTE — Telephone Encounter (Signed)
Requested Prescriptions  Pending Prescriptions Disp Refills  . mirtazapine (REMERON) 15 MG tablet [Pharmacy Med Name: MIRTAZAPINE 15MG TABLETS] 90 tablet 0    Sig: TAKE 1 TABLET(15 MG) BY MOUTH AT BEDTIME     Psychiatry: Antidepressants - mirtazapine Failed - 09/27/2019 10:20 AM      Failed - AST in normal range and within 360 days    AST  Date Value Ref Range Status  09/17/2018 13 10 - 35 U/L Final         Failed - ALT in normal range and within 360 days    ALT  Date Value Ref Range Status  09/17/2018 7 6 - 29 U/L Final         Failed - Triglycerides in normal range and within 360 days    Triglycerides  Date Value Ref Range Status  09/17/2018 123 <150 mg/dL Final         Failed - Total Cholesterol in normal range and within 360 days    Cholesterol, Total  Date Value Ref Range Status  08/25/2014 172 100 - 199 mg/dL Final   Cholesterol  Date Value Ref Range Status  09/17/2018 168 <200 mg/dL Final         Failed - WBC in normal range and within 360 days    WBC  Date Value Ref Range Status  09/17/2018 8.8 3.8 - 10.8 Thousand/uL Final         Passed - Valid encounter within last 6 months    Recent Outpatient Visits          5 months ago Failure to thrive syndrome, adult   [facility] Steele Sizer, MD   1 year ago Benign essential HTN   [facility] Steele Sizer, MD   1 year ago Benign essential HTN   [facility] Steele Sizer, MD   2 years ago Benign essential HTN   [facility] Steele Sizer, MD   2 years ago Benign essential HTN   [facility] Steele Sizer, MD      Future Appointments            In 5 days Steele Sizer, MD [facility], [facility]

## 2019-10-01 NOTE — Progress Notes (Signed)
Name: Meredith Wagner   MRN: [id number]    DOB: [birth year]   Date:10/02/2019       Progress Note  Subjective  Chief Complaint  Chief Complaint  Patient presents with  . Follow-up    HPI  HTN: no dizziness, no chest pain or palpitation, denies side effects of medication.BPis high today but she did not take medications this am. Last visit bp was at goal, we will continue medications and advised daughter to monitor bp at home   Hyperlipidemia: taking Atorvastatin and denies myalgias,she is not having any problems and wants to stay on current medications  Frontotemporal dementia: she is stable except for lack of appetite, normal behavior, cooperative, she is able to shower,gets to feed herself,eating independently even with tremors, prepares breakfast. Daughter dispenses her medication. She lives with her daughterand two great-grandsons.  CKIstage III with secondary hyperparathyroidism.: denies pruritus, or edema. She has normal urine output, she does not want to see nephrologist. We will recheck labs today Daughter is here with her again.   Crohn's disease: doing well,  no recent abdominal pain, diarrhea or rectal bleeding, taking medication as prescribed - Asacol . She is up to date with gastroenterologist   Malnutrition: she has lost another 11 lbs since last visit 6 months ago, 25 lbs in the previous year. She eats but the portions are very small, discussed high calorie diet and will give a list of items to increase calorie intake.   Patient Active Problem List   Diagnosis Date Noted  . Palpitations 05/21/2019  . Mild malnutrition ([place]) 04/02/2019  . Benign hypertension with CKD (chronic kidney disease) stage III 03/13/2018  . Secondary renal hyperparathyroidism ([place]) 03/13/2018  . Hyperglycemia 09/07/2016  . Chronic kidney disease (CKD), stage IV (severe) ([place]) 09/05/2016  . Hearing loss 08/25/2014  . 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 Chronic  kidney disease, stage III (moderate) ([place]) 08/22/2014  . Crohn's disease of large bowel ([place]) 08/22/2014  . Hereditary essential tremor 08/22/2014  . History of iron deficiency anemia 08/22/2014  . OP (osteoporosis) 08/22/2014  . Dementia, frontotemporal ([place]) 06/28/2009  . Vitamin D deficiency 09/28/2008  . 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 Benign essential HTN 06/15/2006  . Dyslipidemia 06/15/2006    Past Surgical History:  Procedure Laterality Date  . ABDOMINAL HYSTERECTOMY  09/07/2009    History reviewed. No pertinent family history.  Social History   Tobacco Use  . Smoking status: Never Smoker  . Smokeless tobacco: Never Used  . Tobacco comment: smoking cessation materials not required  Substance Use Topics  . Alcohol use: No    Alcohol/week: 0.0 standard drinks     Current Outpatient Medications:  .  amLODipine (NORVASC) 5 MG tablet, TAKE 1 TABLET(5 MG) BY MOUTH DAILY, Disp: 90 tablet, Rfl: 1 .  atorvastatin (LIPITOR) 40 MG tablet, TAKE 1 TABLET BY MOUTH EVERY DAY AT 6 PM, Disp: 90 tablet, Rfl: 0 .  chlorthalidone (HYGROTON) 25 MG tablet, Take 12.5 mg by mouth daily., Disp: , Rfl:  .  Cholecalciferol (VITAMIN D3) 50 MCG (2000 UT) capsule, TAKE 1 CAPSULE BY MOUTH ONCE DAILY, Disp: 30 capsule, Rfl: 12 .  donepezil (ARICEPT) 10 MG tablet, Take 1 tablet (10 mg total) by mouth daily., Disp: 90 tablet, Rfl: 1 .  Mesalamine 800 MG TBEC, Take 2 tablets by mouth 2 (two) times daily., Disp: , Rfl:  .  mirtazapine (REMERON) 15 MG tablet, TAKE 1 TABLET(15 MG) BY MOUTH AT BEDTIME, Disp: 90 tablet, Rfl: 0 .  atenolol (TENORMIN) 50 MG tablet, Take 50 mg by mouth daily. (Patient not taking: Reported on 10/02/2019), Disp: , Rfl:   Allergies  Allergen Reactions  . 2,4-D Dimethylamine (Amisol) Swelling  . Ace Inhibitors     Other reaction(s): Angioedema  . Niacin Er   . Penicillins     I personally reviewed active problem list, medication list, allergies, family history, social history,  health maintenance with the patient/caregiver today.   ROS  Constitutional: Negative for fever .Positive for  weight change.  Respiratory: Negative for cough and shortness of breath.   Cardiovascular: Negative for chest pain or palpitations.  Gastrointestinal: Negative for abdominal pain, no bowel changes.  Musculoskeletal: Negative for gait problem or joint swelling.  Skin: Negative for rash.  Neurological: Negative for dizziness or headache. Still has tremors  No other specific complaints in a complete review of systems (except as listed in HPI above).  Objective  Vitals:   10/02/19 0801  BP: (!) 160/80  Pulse: 80  Resp: 16  Temp: (!) 97.4 F (36.3 C)  TempSrc: Oral  Weight: 111 lb (50.3 kg)  Height: 5' 8"  (1.727 m)    Body mass index is 16.88 kg/m.  Physical Exam  Constitutional: Patient appears well-developed and very frail no distress.  HEENT: head atraumatic, normocephalic, pupils equal and reactive to light,  neck supple Cardiovascular: Normal rate, regular rhythm and normal heart sounds.  No murmur heard. No BLE edema. Pulmonary/Chest: Effort normal and breath sounds normal. No respiratory distress. Abdominal: Soft.  There is no tenderness. Neurological: tremors noticed Psychiatric: Patient has a normal mood and affect. behavior is normal. Judgment and thought content normal.  PHQ2/9: Depression screen [facility] 2/9 10/02/2019 09/25/2019 04/02/2019 09/17/2018 03/13/2018  Decreased Interest 0 0 0 0 0  Down, Depressed, Hopeless 0 0 0 0 0  PHQ - 2 Score 0 0 0 0 0  Altered sleeping - - 0 0 0  Tired, decreased energy - - 0 0 0  Change in appetite - - 0 0 0  Feeling bad or failure about yourself  - - 0 0 0  Trouble concentrating - - 0 0 0  Moving slowly or fidgety/restless - - 0 0 0  Suicidal thoughts - - 0 0 0  PHQ-9 Score - - 0 0 0  Difficult doing work/chores - - Not difficult at all - -    phq 9 is negative   Fall Risk: Fall Risk  09/25/2019 04/02/2019 09/17/2018  03/13/2018 09/11/2017  Falls in the past year? 0 0 0 0 No  Number falls in past yr: 0 0 0 0 -  Injury with Fall? 0 0 0 0 -  Risk for fall due to : No Fall Risks - - - Impaired vision;Medication side effect;Other (Comment)  Risk for fall due to: Comment - - - - wears eyeglasses; dementia  Follow up Falls prevention discussed - - - -     Functional Status Survey: Is the patient deaf or have difficulty hearing?: Yes Does the patient have difficulty seeing, even when wearing glasses/contacts?: No Does the patient have difficulty concentrating, remembering, or making decisions?: Yes Does the patient have difficulty walking or climbing stairs?: No Does the patient have difficulty dressing or bathing?: No Does the patient have difficulty doing errands alone such as visiting a doctor's office or shopping?: Yes    Assessment & Plan  1. Need for immunization against influenza  - Flu Vaccine QUAD High Dose(Fluad)  2. Benign essential HTN  -  amLODipine (NORVASC) 5 MG tablet; Take 1 tablet (5 mg total) by mouth daily.  Dispense: 90 tablet; Refill: 1 - CBC with Differential/Platelet - COMPLETE METABOLIC PANEL WITH GFR  3. Lack of appetite  - mirtazapine (REMERON) 15 MG tablet; Take 1 tablet (15 mg total) by mouth at bedtime.  Dispense: 90 tablet; Refill: 1  4. Dementia, frontotemporal ([place])  - donepezil (ARICEPT) 10 MG tablet; Take 1 tablet (10 mg total) by mouth daily.  Dispense: 90 tablet; Refill: 1  5. Crohn's disease of large intestine without complication ([place])   6. Chronic kidney disease (CKD), stage IV (severe) ([place])   7. Moderate malnutrition ([place])   8. Vitamin D deficiency  - VITAMIN D 25 Hydroxy (Vit-D Deficiency, Fractures)  9. Benign hypertension with CKD (chronic kidney disease) stage III  - COMPLETE METABOLIC PANEL WITH GFR - Lipid panel  10. Secondary renal hyperparathyroidism (H[facility])  - Parathyroid hormone, intact (no Ca)

## 2019-10-02 ENCOUNTER — Other Ambulatory Visit: Payer: Self-pay

## 2019-10-02 ENCOUNTER — Ambulatory Visit (INDEPENDENT_AMBULATORY_CARE_PROVIDER_SITE_OTHER): Payer: Medicare Other | Admitting: Family Medicine

## 2019-10-02 ENCOUNTER — Encounter: Payer: Self-pay | Admitting: Family Medicine

## 2019-10-02 VITALS — BP 160/80 | HR 80 | Temp 97.4°F | Resp 16 | Ht 68.0 in | Wt 111.0 lb

## 2019-10-02 DIAGNOSIS — I10 Essential (primary) hypertension: Secondary | ICD-10-CM | POA: Diagnosis not present

## 2019-10-02 DIAGNOSIS — Z23 Encounter for immunization: Secondary | ICD-10-CM | POA: Diagnosis not present

## 2019-10-02 DIAGNOSIS — N183 Chronic kidney disease, stage 3 unspecified: Secondary | ICD-10-CM

## 2019-10-02 DIAGNOSIS — F0280 Dementia in other diseases classified elsewhere without behavioral disturbance: Secondary | ICD-10-CM

## 2019-10-02 DIAGNOSIS — G3109 Other frontotemporal dementia: Secondary | ICD-10-CM

## 2019-10-02 DIAGNOSIS — I129 Hypertensive chronic kidney disease with stage 1 through stage 4 chronic kidney disease, or unspecified chronic kidney disease: Secondary | ICD-10-CM

## 2019-10-02 DIAGNOSIS — E440 Moderate protein-calorie malnutrition: Secondary | ICD-10-CM

## 2019-10-02 DIAGNOSIS — R630 Anorexia: Secondary | ICD-10-CM | POA: Diagnosis not present

## 2019-10-02 DIAGNOSIS — E559 Vitamin D deficiency, unspecified: Secondary | ICD-10-CM

## 2019-10-02 DIAGNOSIS — N184 Chronic kidney disease, stage 4 (severe): Secondary | ICD-10-CM

## 2019-10-02 DIAGNOSIS — N2581 Secondary hyperparathyroidism of renal origin: Secondary | ICD-10-CM

## 2019-10-02 DIAGNOSIS — K5010 Crohn's disease of large intestine without complications: Secondary | ICD-10-CM

## 2019-10-02 MED ORDER — MIRTAZAPINE 15 MG PO TABS: 15.0000 mg | ORAL_TABLET | Freq: Every day | ORAL | 1 refills | Status: DC

## 2019-10-02 MED ORDER — AMLODIPINE BESYLATE 5 MG PO TABS: 5.0000 mg | ORAL_TABLET | Freq: Every day | ORAL | 1 refills | Status: DC

## 2019-10-02 MED ORDER — DONEPEZIL HCL 10 MG PO TABS: 10.0000 mg | ORAL_TABLET | Freq: Every day | ORAL | 1 refills | Status: DC

## 2019-10-02 NOTE — Patient Instructions (Addendum)
High-Protein and High-Calorie Diet Eating high-protein and high-calorie foods can help you to gain weight, heal after an injury, and recover after an illness or surgery. The specific amount of daily protein and calories you need depends on:  Your body weight.  The reason this diet is recommended for you. What is my plan? Generally, a high-protein, high-calorie diet involves:  Eating 250-500 extra calories each day.  Making sure that you get enough of your daily calories from protein. Ask your health care provider how many of your calories should come from protein. Talk with a health care provider, such as a diet and nutrition specialist (dietitian), about how much protein and how many calories you need each day. Follow the diet as directed by your health care provider. What are tips for following this plan?  Preparing meals  Add whole milk, half-and-half, or heavy cream to cereal, pudding, soup, or hot cocoa.  Add whole milk to instant breakfast drinks.  Add peanut butter to oatmeal or smoothies.  Add powdered milk to baked goods, smoothies, or milkshakes.  Add powdered milk, cream, or butter to mashed potatoes.  Add cheese to cooked vegetables.  Make whole-milk yogurt parfaits. Top them with granola, fruit, or nuts.  Add cottage cheese to your fruit.  Add avocado, cheese, or both to sandwiches or salads.  Add meat, poultry, or seafood to rice, pasta, casseroles, salads, and soups.  Use mayonnaise when making egg salad, chicken salad, or tuna salad.  Use peanut butter as a dip for vegetables or as a topping for pretzels, celery, or crackers.  Add beans to casseroles, dips, and spreads.  Add pureed beans to sauces and soups.  Replace calorie-free drinks with calorie-containing drinks, such as milk and fruit juice.  Replace water with milk or heavy cream when making foods such as oatmeal, pudding, or cocoa. General instructions  Ask your health care provider if you  should take a nutritional supplement.  Try to eat six small meals each day instead of three large meals.  Eat a balanced diet. In each meal, include one food that is high in protein.  Keep nutritious snacks available, such as nuts, trail mixes, dried fruit, and yogurt.  If you have kidney disease or diabetes, talk with your health care provider about how much protein is safe for you. Too much protein may put extra stress on your kidneys.  Drink your calories. Choose high-calorie drinks and have them after your meals. What high-protein foods should I eat?  Vegetables Soybeans. Peas. Grains Quinoa. Bulgur wheat. Meats and other proteins Beef, pork, and poultry. Fish and seafood. Eggs. Tofu. Textured vegetable protein (TVP). Peanut butter. Nuts and seeds. Dried beans. Protein powders. Dairy Whole milk. Whole-milk yogurt. Powdered milk. Cheese. Yahoo. Eggnog. Beverages High-protein supplement drinks. Soy milk. Other foods Protein bars. The items listed above may not be a complete list of high-protein foods and beverages. Contact a dietitian for more options. What high-calorie foods should I eat? Fruits Dried fruit. Fruit leather. Canned fruit in syrup. Fruit juice. Avocado. Vegetables Vegetables cooked in oil or butter. Fried potatoes. Grains Pasta. Quick breads. Muffins. Pancakes. Ready-to-eat cereal. Meats and other proteins Peanut butter. Nuts and seeds. Dairy Heavy cream. Whipped cream. Cream cheese. Sour cream. Ice cream. Custard. Pudding. Beverages Meal-replacement beverages. Nutrition shakes. Fruit juice. Sugar-sweetened soft drinks. Seasonings and condiments Salad dressing. Mayonnaise. Alfredo sauce. Fruit preserves or jelly. Honey. Syrup. Sweets and desserts Cake. Cookies. Pie. Pastries. Candy bars. Chocolate. Fats and oils Butter or margarine.  Oil. Gravy. Other foods Meal-replacement bars. The items listed above may not be a complete list of high-calorie  foods and beverages. Contact a dietitian for more options. Summary  A high-protein, high-calorie diet can help you gain weight or heal faster after an injury, illness, or surgery.  To increase your protein and calories, add ingredients such as whole milk, peanut butter, cheese, beans, meat, or seafood to meal items.  To get enough extra calories each day, include high-calorie foods and beverages at each meal.  Adding a high-calorie drink or shake can be an easy way to help you get enough calories each day. Talk with your healthcare provider or dietitian about the best options for you. This information is not intended to replace advice given to you by your health care provider. Make sure you discuss any questions you have with your health care provider. Document Revised: 12/01/2016 Document Reviewed: 10/31/2016 Elsevier Patient Education  [place]. Influenza (Flu) Vaccine (Inactivated or Recombinant): What You Need to Know 1. Why get vaccinated? Influenza vaccine can prevent influenza (flu). Flu is a contagious disease that spreads around the [place] every year, usually between October and May. Anyone can get the flu, but it is more dangerous for some people. Infants and young children, people 52 years of age and older, pregnant women, and people with certain health conditions or a weakened immune system are at greatest risk of flu complications. Pneumonia, bronchitis, sinus infections and ear infections are examples of flu-related complications. If you have a medical condition, such as heart disease, cancer or diabetes, flu can make it worse. Flu can cause fever and chills, sore throat, muscle aches, fatigue, cough, headache, and runny or stuffy nose. Some people may have vomiting and diarrhea, though this is more common in children than adults. Each year thousands of people in the [place] die from flu, and many more are hospitalized. Flu vaccine prevents millions of  illnesses and flu-related visits to the doctor each year. 2. Influenza vaccine CDC recommends everyone 32 months of age and older get vaccinated every flu season. Children 6 months through 3 years of age may need 2 doses during a single flu season. Everyone else needs only 1 dose each flu season. It takes about 2 weeks for protection to develop after vaccination. There are many flu viruses, and they are always changing. Each year a new flu vaccine is made to protect against three or four viruses that are likely to cause disease in the upcoming flu season. Even when the vaccine doesn't exactly match these viruses, it may still provide some protection. Influenza vaccine does not cause flu. Influenza vaccine may be given at the same time as other vaccines. 3. Talk with your health care provider Tell your vaccine provider if the person getting the vaccine:  Has had an allergic reaction after a previous dose of influenza vaccine, or has any severe, life-threatening allergies.  Has ever had Guillain-Barr Syndrome (also called GBS). In some cases, your health care provider may decide to postpone influenza vaccination to a future visit. People with minor illnesses, such as a cold, may be vaccinated. People who are moderately or severely ill should usually wait until they recover before getting influenza vaccine. Your health care provider can give you more information. 4. Risks of a vaccine reaction  Soreness, redness, and swelling where shot is given, fever, muscle aches, and headache can happen after influenza vaccine.  There may be a very small increased risk of Guillain-Barr Syndrome (  GBS) after inactivated influenza vaccine (the flu shot). Young children who get the flu shot along with pneumococcal vaccine (PCV13), and/or DTaP vaccine at the same time might be slightly more likely to have a seizure caused by fever. Tell your health care provider if a child who is getting flu vaccine has ever had a  seizure. People sometimes faint after medical procedures, including vaccination. Tell your provider if you feel dizzy or have vision changes or ringing in the ears. As with any medicine, there is a very remote chance of a vaccine causing a severe allergic reaction, other serious injury, or death. 5. What if there is a serious problem? An allergic reaction could occur after the vaccinated person leaves the clinic. If you see signs of a severe allergic reaction (hives, swelling of the face and throat, difficulty breathing, a fast heartbeat, dizziness, or weakness), call 9-1-1 and get the person to the nearest hospital. For other signs that concern you, call your health care provider. Adverse reactions should be reported to the Vaccine Adverse Event Reporting System (VAERS). Your health care provider will usually file this report, or you can do it yourself. Visit the VAERS website at www.vaers.SamedayNews.es or call 305-206-3693.VAERS is only for reporting reactions, and VAERS staff do not give medical advice. 6. The National Vaccine Injury Compensation Program The [employer] Vaccine Injury Compensation Program (VICP) is a federal program that was created to compensate people who may have been injured by certain vaccines. Visit the VICP website at GoldCloset.com.ee or call 517-673-3494 to learn about the program and about filing a claim. There is a time limit to file a claim for compensation. 7. How can I learn more?  Ask your healthcare provider.  Call your local or state health department.  Contact the Centers for Disease Control and Prevention (CDC): ? Call 432-544-5279 (1-800-CDC-INFO) or ? Visit CDC's https://gibson.com/ Vaccine Information Statement (Interim) Inactivated Influenza Vaccine (08/16/2017) This information is not intended to replace advice given to you by your health care provider. Make sure you discuss any questions you have with your health care provider. Document Revised:  04/09/2018 Document Reviewed: 08/20/2017 Elsevier Patient Education  [place].

## 2019-10-03 LAB — COMPLETE METABOLIC PANEL WITH GFR
AG Ratio: 1.3 (calc) (ref 1.0–2.5)
ALT: 10 U/L (ref 6–29)
AST: 20 U/L (ref 10–35)
Albumin: 4.2 g/dL (ref 3.6–5.1)
Alkaline phosphatase (APISO): 68 U/L (ref 37–153)
BUN/Creatinine Ratio: 15 (calc) (ref 6–22)
BUN: 22 mg/dL (ref 7–25)
CO2: 25 mmol/L (ref 20–32)
Calcium: 9.8 mg/dL (ref 8.6–10.4)
Chloride: 103 mmol/L (ref 98–110)
Creat: 1.44 mg/dL — ABNORMAL HIGH (ref 0.60–0.88)
GFR, Est African American: 35 mL/min/{1.73_m2} — ABNORMAL LOW (ref >=60)
GFR, Est Non African American: 30 mL/min/{1.73_m2} — ABNORMAL LOW (ref >=60)
Globulin: 3.2 g/dL (calc) (ref 1.9–3.7)
Glucose, Bld: 123 mg/dL — ABNORMAL HIGH (ref 65–99)
Potassium: 3.6 mmol/L (ref 3.5–5.3)
Sodium: 140 mmol/L (ref 135–146)
Total Bilirubin: 0.6 mg/dL (ref 0.2–1.2)
Total Protein: 7.4 g/dL (ref 6.1–8.1)

## 2019-10-03 LAB — CBC WITH DIFFERENTIAL/PLATELET
Absolute Monocytes: 692 cells/uL (ref 200–950)
Basophils Absolute: 99 cells/uL (ref 0–200)
Basophils Relative: 1.3 %
Eosinophils Absolute: 91 cells/uL (ref 15–500)
Eosinophils Relative: 1.2 %
HCT: 33.9 % — ABNORMAL LOW (ref 35.0–45.0)
Hemoglobin: 11.1 g/dL — ABNORMAL LOW (ref 11.7–15.5)
Lymphs Abs: 3260 cells/uL (ref 850–3900)
MCH: 30.2 pg (ref 27.0–33.0)
MCHC: 32.7 g/dL (ref 32.0–36.0)
MCV: 92.4 fL (ref 80.0–100.0)
MPV: 11.2 fL (ref 7.5–12.5)
Monocytes Relative: 9.1 %
Neutro Abs: 3458 cells/uL (ref 1500–7800)
Neutrophils Relative %: 45.5 %
Platelets: 275 10*3/uL (ref 140–400)
RBC: 3.67 10*6/uL — ABNORMAL LOW (ref 3.80–5.10)
RDW: 13.4 % (ref 11.0–15.0)
Total Lymphocyte: 42.9 %
WBC: 7.6 10*3/uL (ref 3.8–10.8)

## 2019-10-03 LAB — LIPID PANEL
Cholesterol: 165 mg/dL (ref <200)
HDL: 67 mg/dL (ref >=50)
LDL Cholesterol (Calc): 77 mg/dL (calc)
Non-HDL Cholesterol (Calc): 98 mg/dL (calc) (ref <130)
Total CHOL/HDL Ratio: 2.5 (calc) (ref <5.0)
Triglycerides: 119 mg/dL (ref <150)

## 2019-10-03 LAB — PARATHYROID HORMONE, INTACT (NO CA): PTH: 82 pg/mL — ABNORMAL HIGH (ref 14–64)

## 2019-10-03 LAB — VITAMIN D 25 HYDROXY (VIT D DEFICIENCY, FRACTURES): Vit D, 25-Hydroxy: 86 ng/mL (ref 30–100)

## 2019-10-20 ENCOUNTER — Other Ambulatory Visit: Payer: Self-pay | Admitting: Family Medicine

## 2019-10-20 DIAGNOSIS — I10 Essential (primary) hypertension: Secondary | ICD-10-CM

## 2019-10-27 ENCOUNTER — Other Ambulatory Visit: Payer: Self-pay | Admitting: Family Medicine

## 2019-10-27 DIAGNOSIS — I10 Essential (primary) hypertension: Secondary | ICD-10-CM

## 2019-11-26 ENCOUNTER — Other Ambulatory Visit: Payer: Self-pay | Admitting: Family Medicine

## 2019-11-26 DIAGNOSIS — E785 Hyperlipidemia, unspecified: Secondary | ICD-10-CM

## 2020-03-15 ENCOUNTER — Other Ambulatory Visit: Payer: Self-pay | Admitting: Family Medicine

## 2020-03-15 DIAGNOSIS — R630 Anorexia: Secondary | ICD-10-CM

## 2020-03-25 ENCOUNTER — Other Ambulatory Visit: Payer: Self-pay | Admitting: Family Medicine

## 2020-03-25 DIAGNOSIS — F0280 Dementia in other diseases classified elsewhere without behavioral disturbance: Secondary | ICD-10-CM

## 2020-03-25 NOTE — Telephone Encounter (Signed)
Future visit in 5 days  

## 2020-03-30 NOTE — Progress Notes (Signed)
Name: Meredith Wagner   MRN: [id number]    DOB: 1917/07/08   Date:03/31/2020       Progress Note  Subjective  Chief Complaint  Follow up   HPI   HTN: no dizziness, no chest pain or palpitation, denies side effects of medication.BP is low, even though she did not take chlorthalidone or Atenolol this am. She needs Atenolol to control essential tremors. Daughter will monitor bp at home and if stays below 130/80, we will change to hctz 12.5 mg, she is also interested on [facility]   Hyperlipidemia: taking Atorvastatin and denies myalgias,she is not having any problems and wants to stay on current medications  Frontotemporal dementia: she is stable except for lack of appetite, normal behavior, cooperative, she is able to shower,gets to feed herself,eating independently even with tremors, she still prepares her own  breakfast. Daughter dispenses her medication. She lives with her daughter Meredith Wagner ) and one  great-grandsons.  CKIstage III with secondary hyperparathyroidism.: denies pruritus, or edema. She has normal urine output, she does not want to see nephrologist. Reviewed labs done 6 months ago GFR stable in the 30's, elevated parathyroid level but also stable.   Crohn's disease: doing well,  no recent abdominal pain, diarrhea or rectal bleeding, taking medication as prescribed - Asacol . She is up to date with gastroenterologist once a year.   Malnutrition: she  lost another 4 lbs since last visit 6 months ago She eats but the portions are very small,I will give another list of high calorie diet. Also advised to give multiple snacks throughout the day. She did not like Ensure, but may be able to tolerate Premier   Patient Active Problem List   Diagnosis Date Noted  . Palpitations 05/21/2019  . Mild malnutrition ([place]) 04/02/2019  . Benign hypertension with CKD (chronic kidney disease) stage III ([place]) 03/13/2018  . Secondary renal hyperparathyroidism ([place]) 03/13/2018   . Hyperglycemia 09/07/2016  . Chronic kidney disease (CKD), stage IV (severe) ([place]) 09/05/2016  . Hearing loss 08/25/2014  . 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 Chronic kidney disease, stage III (moderate) ([place]) 08/22/2014  . Crohn's disease of large bowel ([place]) 08/22/2014  . Hereditary essential tremor 08/22/2014  . History of iron deficiency anemia 08/22/2014  . OP (osteoporosis) 08/22/2014  . Dementia, frontotemporal ([place]) 06/28/2009  . Vitamin D deficiency 09/28/2008  . 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 Benign essential HTN 06/15/2006  . Dyslipidemia 06/15/2006    Past Surgical History:  Procedure Laterality Date  . ABDOMINAL HYSTERECTOMY  09/07/2009    No family history on file.  Social History   Tobacco Use  . Smoking status: Never Smoker  . Smokeless tobacco: Never Used  . Tobacco comment: smoking cessation materials not required  Substance Use Topics  . Alcohol use: No    Alcohol/week: 0.0 standard drinks     Current Outpatient Medications:  .  amLODipine (NORVASC) 5 MG tablet, TAKE 1 TABLET(5 MG) BY MOUTH DAILY, Disp: 90 tablet, Rfl: 1 .  atenolol (TENORMIN) 50 MG tablet, Take 50 mg by mouth daily., Disp: , Rfl:  .  atorvastatin (LIPITOR) 40 MG tablet, TAKE 1 TABLET BY MOUTH EVERY DAY AT 6 PM, Disp: 90 tablet, Rfl: 1 .  chlorthalidone (HYGROTON) 25 MG tablet, Take 12.5 mg by mouth daily., Disp: , Rfl:  .  cholecalciferol (VITAMIN D3) 25 MCG (1000 UNIT) tablet, Take 1,000 Units by mouth daily., Disp: , Rfl:  .  Cholecalciferol (VITAMIN D3) 50 MCG (2000 UT) capsule,  TAKE 1 CAPSULE BY MOUTH ONCE DAILY, Disp: 30 capsule, Rfl: 12 .  donepezil (ARICEPT) 10 MG tablet, TAKE 1 TABLET(10 MG) BY MOUTH DAILY, Disp: 90 tablet, Rfl: 0 .  Mesalamine 800 MG TBEC, Take 2 tablets by mouth 2 (two) times daily., Disp: , Rfl:  .  mirtazapine (REMERON) 15 MG tablet, TAKE 1 TABLET(15 MG) BY MOUTH AT BEDTIME, Disp: 90 tablet, Rfl: 1  Allergies  Allergen Reactions  . 2,4-D  Dimethylamine (Amisol) Swelling  . Ace Inhibitors     Other reaction(s): Angioedema  . Niacin Er   . Penicillins     I personally reviewed active problem list, medication list, allergies, family history, social history, health maintenance with the patient/caregiver today.   ROS  Ten systems reviewed and is negative except as mentioned in HPI   Objective  Vitals:   03/31/20 0850  BP: 138/82  Pulse: 82  Resp: 16  Temp: 98.2 F (36.8 C)  TempSrc: Oral  SpO2: 96%  Weight: 107 lb (48.5 kg)  Height: 5' 8"  (1.727 m)    Body mass index is 16.27 kg/m.  Physical Exam   Constitutional: Patient appears well-developed and malnutrition. No distress.  HEENT: head atraumatic, normocephalic, pupils equal and reactive to light,  neck supple Cardiovascular: Normal rate, regular rhythm and normal heart sounds.  No murmur heard. No BLE edema. Pulmonary/Chest: Effort normal and breath sounds normal. No respiratory distress. Abdominal: Soft.  There is no tenderness. Neurological: tremors of hands Psychiatric: difficulty hearing, but smiles and is cooperative   PHQ2/9: Depression screen [facility] 2/9 03/31/2020 10/02/2019 09/25/2019 04/02/2019 09/17/2018  Decreased Interest 0 0 0 0 0  Down, Depressed, Hopeless 0 0 0 0 0  PHQ - 2 Score 0 0 0 0 0  Altered sleeping - - - 0 0  Tired, decreased energy - - - 0 0  Change in appetite - - - 0 0  Feeling bad or failure about yourself  - - - 0 0  Trouble concentrating - - - 0 0  Moving slowly or fidgety/restless - - - 0 0  Suicidal thoughts - - - 0 0  PHQ-9 Score - - - 0 0  Difficult doing work/chores - - - Not difficult at all -    phq 9 is negative   Fall Risk: Fall Risk  03/31/2020 09/25/2019 04/02/2019 09/17/2018 03/13/2018  Falls in the past year? 0 0 0 0 0  Number falls in past yr: 0 0 0 0 0  Injury with Fall? - 0 0 0 0  Risk for fall due to : - No Fall Risks - - -  Risk for fall due to: Comment - - - - -  Follow up - Falls prevention discussed  - - -     Functional Status Survey: Is the patient deaf or have difficulty hearing?: Yes Does the patient have difficulty seeing, even when wearing glasses/contacts?: No Does the patient have difficulty concentrating, remembering, or making decisions?: Yes Does the patient have difficulty walking or climbing stairs?: No Does the patient have difficulty dressing or bathing?: No Does the patient have difficulty doing errands alone such as visiting a doctor's office or shopping?: No    Assessment & Plan  1. Moderate malnutrition ([place])  Discussed high calorie diet   2. Dementia, frontotemporal ([place])  - AMB Referral to [facility]  3. Benign essential HTN   4. Crohn's disease of large intestine without complication ([place])  No recent problems   5.  Vitamin D deficiency  Continue supplementation   6. Chronic kidney disease (CKD), stage IV (severe) (H[facility])  Stable   7. Benign hypertension with CKD (chronic kidney disease) stage III ([facility])  - [facility]  8. Secondary renal hyperparathyroidism ([facility])  - AMB Referral to [facility]  9. Dyslipidemia  Daughter wants to continue statin therapy   10. Hereditary essential tremor  Stable

## 2020-03-31 ENCOUNTER — Encounter: Payer: Self-pay | Admitting: Family Medicine

## 2020-03-31 ENCOUNTER — Ambulatory Visit (INDEPENDENT_AMBULATORY_CARE_PROVIDER_SITE_OTHER): Payer: Medicare Other | Admitting: Family Medicine

## 2020-03-31 ENCOUNTER — Other Ambulatory Visit: Payer: Self-pay

## 2020-03-31 VITALS — BP 138/82 | HR 82 | Temp 98.2°F | Resp 16 | Ht 68.0 in | Wt 107.0 lb

## 2020-03-31 DIAGNOSIS — G250 Essential tremor: Secondary | ICD-10-CM

## 2020-03-31 DIAGNOSIS — I129 Hypertensive chronic kidney disease with stage 1 through stage 4 chronic kidney disease, or unspecified chronic kidney disease: Secondary | ICD-10-CM

## 2020-03-31 DIAGNOSIS — N184 Chronic kidney disease, stage 4 (severe): Secondary | ICD-10-CM

## 2020-03-31 DIAGNOSIS — E559 Vitamin D deficiency, unspecified: Secondary | ICD-10-CM

## 2020-03-31 DIAGNOSIS — E440 Moderate protein-calorie malnutrition: Secondary | ICD-10-CM | POA: Diagnosis not present

## 2020-03-31 DIAGNOSIS — E785 Hyperlipidemia, unspecified: Secondary | ICD-10-CM

## 2020-03-31 DIAGNOSIS — N183 Chronic kidney disease, stage 3 unspecified: Secondary | ICD-10-CM

## 2020-03-31 DIAGNOSIS — I10 Essential (primary) hypertension: Secondary | ICD-10-CM | POA: Diagnosis not present

## 2020-03-31 DIAGNOSIS — G3109 Other frontotemporal dementia: Secondary | ICD-10-CM

## 2020-03-31 DIAGNOSIS — F0280 Dementia in other diseases classified elsewhere without behavioral disturbance: Secondary | ICD-10-CM

## 2020-03-31 DIAGNOSIS — K5010 Crohn's disease of large intestine without complications: Secondary | ICD-10-CM

## 2020-03-31 DIAGNOSIS — N2581 Secondary hyperparathyroidism of renal origin: Secondary | ICD-10-CM

## 2020-03-31 NOTE — Patient Instructions (Signed)

## 2020-04-01 ENCOUNTER — Telehealth: Payer: Self-pay | Admitting: *Deleted

## 2020-04-01 NOTE — Chronic Care Management (AMB) (Signed)
  Chronic Care Management   Note  04/01/2020 Name: LAILANI TOOL MRN: [id number] DOB: 1917-08-06  Shary Decamp is a 87 y.o. year old female who is a primary care patient of Steele Sizer, MD. I reached out to Shary Decamp by phone today in response to a referral sent by Ms. Laurann [place] Cwikla's PCP, Steele Sizer, MD     Ms. [place] daughter Alayziah Tangeman was given information about Chronic Care Management services today including:  1. CCM service includes personalized support from designated clinical staff supervised by her physician, including individualized plan of care and coordination with other care providers 2. 24/7 contact phone numbers for assistance for urgent and routine care needs. 3. Service will only be billed when office clinical staff spend 20 minutes or more in a month to coordinate care. 4. Only one practitioner may furnish and bill the service in a calendar month. 5. The patient may stop CCM services at any time (effective at the end of the month) by phone call to the office staff. 6. The patient will be responsible for cost sharing (co-pay) of up to 20% of the service fee (after annual deductible is met).  Patient's daughter  Harshini Trent agreed to services and verbal consent obtained.   Follow up plan: Telephone appointment with care management team member scheduled for: 04/15/2020  [facility]

## 2020-04-08 ENCOUNTER — Telehealth: Payer: Self-pay

## 2020-04-08 NOTE — Progress Notes (Signed)
    Chronic Care Management Pharmacy Assistant   Name: Meredith Wagner  MRN: [id number] DOB: 09-16-1917   Reason for Encounter: Medication Review/Initial question for Initial visit with clinical pharmacist on 04/15/2020.   Recent office visits:  N/A  Recent consult visits:  [facility] visits:  None in previous 6 months  Medications: Outpatient Encounter Medications as of 04/08/2020  Medication Sig  . amLODipine (NORVASC) 5 MG tablet TAKE 1 TABLET(5 MG) BY MOUTH DAILY  . atenolol (TENORMIN) 50 MG tablet Take 50 mg by mouth daily.  Mar[place] Kitchen atorvastatin (LIPITOR) 40 MG tablet TAKE 1 TABLET BY MOUTH EVERY DAY AT 6 PM  . chlorthalidone (HYGROTON) 25 MG tablet Take 12.5 mg by mouth daily.  . Cholecalciferol (VITAMIN D3) 50 MCG (2000 UT) capsule TAKE 1 CAPSULE BY MOUTH ONCE DAILY  . donepezil (ARICEPT) 10 MG tablet TAKE 1 TABLET(10 MG) BY MOUTH DAILY  . Mesalamine 800 MG TBEC Take 2 tablets by mouth 2 (two) times daily.  . mirtazapine (REMERON) 15 MG tablet TAKE 1 TABLET(15 MG) BY MOUTH AT BEDTIME   No facility-administered encounter medications on file as of 04/08/2020.   Star Rating Drugs: Atorvastatin 40 mg last filled on 02/24/2020 for 90 Day supply at [facility].  Have you seen any other providers since your last visit? no Any changes in your medications or health? no Any side effects from any medications? no Do you have an symptoms or problems not managed by your medications? no Any concerns about your health right now? no Has your provider asked that you check blood pressure, blood sugar, or follow special diet at home? no Do you get any type of exercise on a regular basis? no Can you think of a goal you would like to reach for your health? None ID Do you have any problems getting your medications? no Is there anything that you would like to discuss during the appointment? Over all health  Please bring medications and supplements to appointment   [place] Pharmacist Assistant [phone]

## 2020-04-14 NOTE — Progress Notes (Deleted)
Chronic Care Management Pharmacy Note  04/14/2020 Name:  Meredith Wagner MRN:  [id number] DOB:  02/09/1917  Subjective: Meredith Wagner is an 87 y.o. year old female who is a primary patient of Steele Sizer, MD.  The CCM team was consulted for assistance with disease management and care coordination needs.    Engaged with patient by telephone for initial visit in response to provider referral for pharmacy case management and/or care coordination services.   Consent to Services:  The patient was given the following information about Chronic Care Management services today, agreed to services, and gave verbal consent: 1. CCM service includes personalized support from designated clinical staff supervised by the primary care provider, including individualized plan of care and coordination with other care providers 2. 24/7 contact phone numbers for assistance for urgent and routine care needs. 3. Service will only be billed when office clinical staff spend 20 minutes or more in a month to coordinate care. 4. Only one practitioner may furnish and bill the service in a calendar month. 5.The patient may stop CCM services at any time (effective at the end of the month) by phone call to the office staff. 6. The patient will be responsible for cost sharing (co-pay) of up to 20% of the service fee (after annual deductible is met). Patient agreed to services and consent obtained.  Patient Care Team: Steele Sizer, MD as PCP - General (Family Medicine) Ubaldo Glassing Javier Docker, MD as Consulting Physician (Cardiology) Rodney Booze as Physician Assistant Germaine Pomfret, [facility] (Pharmacist)  Recent office visits: 03/31/20: Patient presented to Dr. Ancil Boozer for follow-up. Vitamin D increased to 2000 units daily. Recommended premier protein shakes for malnutrition.   Recent consult visits: None in previous 6 months  Hospital visits: None in previous 6 months  Objective:  Lab Results  Component  Value Date   CREATININE 1.44 (H) 10/02/2019   BUN 22 10/02/2019   GFRNONAA 30 (L) 10/02/2019   GFRAA 35 (L) 10/02/2019   NA 140 10/02/2019   K 3.6 10/02/2019   CALCIUM 9.8 10/02/2019   CO2 25 10/02/2019   GLUCOSE 123 (H) 10/02/2019    Lab Results  Component Value Date/Time   HGBA1C 5.4 08/30/2015 07:42 AM    Last diabetic Eye exam: No results found for: HMDIABEYEEXA  Last diabetic Foot exam: No results found for: HMDIABFOOTEX   Lab Results  Component Value Date   CHOL 165 10/02/2019   HDL 67 10/02/2019   LDLCALC 77 10/02/2019   TRIG 119 10/02/2019   CHOLHDL 2.5 10/02/2019    Hepatic Function Latest Ref Rng & Units 10/02/2019 09/17/2018 09/11/2017  Total Protein 6.1 - 8.1 g/dL 7.4 7.4 7.7  Albumin 3.6 - 5.1 g/dL - - -  AST 10 - 35 U/L _0 ALT 6 - 29 U/L _1 Alk Phosphatase 33 - 130 U/L - - -  Total Bilirubin 0.2 - 1.2 mg/dL 0.6 0.6 0.5    No results found for: TSH, FREET4  CBC Latest Ref Rng & Units 10/02/2019 09/17/2018 09/11/2017  WBC 3.8 - 10.8 Thousand/uL 7.6 8.8 9.0  Hemoglobin 11.7 - 15.5 g/dL 11.1(L) 12.5 13.2  Hematocrit 35.0 - 45.0 % 33.9(L) 37.8 39.2  Platelets 140 - 400 Thousand/uL 275 305 320    Lab Results  Component Value Date/Time   VD25OH 86 10/02/2019 08:44 AM   VD25OH 62 09/11/2017 10:03 AM    Clinical ASCVD: {YES/NO:21197} The ASCVD Risk score (Queens.,  et al., 2013) failed to calculate for the following reasons:   The 2013 ASCVD risk score is only valid for ages 75 to 60    Depression screen PHQ 2/9 03/31/2020 10/02/2019 09/25/2019  Decreased Interest 0 0 0  Down, Depressed, Hopeless 0 0 0  PHQ - 2 Score 0 0 0  Altered sleeping - - -  Tired, decreased energy - - -  Change in appetite - - -  Feeling bad or failure about yourself  - - -  Trouble concentrating - - -  Moving slowly or fidgety/restless - - -  Suicidal thoughts - - -  PHQ-9 Score - - -  Difficult doing work/chores - - -     ***Other: (CHADS2VASc if Afib, MMRC or  CAT for COPD, ACT, DEXA)  Social History   Tobacco Use  Smoking Status Never Smoker  Smokeless Tobacco Never Used  Tobacco Comment   smoking cessation materials not required   BP Readings from Last 3 Encounters:  03/31/20 138/82  10/02/19 (!) 160/80  04/02/19 130/80   Pulse Readings from Last 3 Encounters:  03/31/20 82  10/02/19 80  04/02/19 94   Wt Readings from Last 3 Encounters:  03/31/20 107 lb (48.5 kg)  10/02/19 111 lb (50.3 kg)  04/02/19 119 lb 12.8 oz (54.3 kg)   BMI Readings from Last 3 Encounters:  03/31/20 16.27 kg/m  10/02/19 16.88 kg/m  04/02/19 18.22 kg/m    Assessment/Interventions: Review of patient past medical history, allergies, medications, health status, including review of consultants reports, laboratory and other test data, was performed as part of comprehensive evaluation and provision of chronic care management services.   SDOH:  (Social Determinants of Health) assessments and interventions performed: {yes/no:20286}  SDOH Screenings   Alcohol Screen: Not on file  Depression (PHQ2-9): Low Risk   . PHQ-2 Score: 0  Financial Resource Strain: Low Risk   . Difficulty of Paying Living Expenses: Not hard at all  Food Insecurity: No Food Insecurity  . Worried About Charity fundraiser in the Last Year: Never true  . Ran Out of Food in the Last Year: Never true  Housing: Low Risk   . Last Housing Risk Score: 0  Physical Activity: Inactive  . Days of Exercise per Week: 0 days  . Minutes of Exercise per Session: 0 min  Social Connections: Unknown  . Frequency of Communication with Friends and Family: Patient refused  . Frequency of Social Gatherings with Friends and Family: Patient refused  . Attends Religious Services: Patient refused  . Active Member of Clubs or Organizations: Patient refused  . Attends Archivist Meetings: Patient refused  . Marital Status: Divorced  Stress: No Stress Concern Present  . Feeling of Stress : Not at  all  Tobacco Use: Low Risk   . Smoking Tobacco Use: Never Smoker  . Smokeless Tobacco Use: Never Used  Transportation Needs: No Transportation Needs  . Lack of Transportation (Medical): No  . Lack of Transportation (Non-Medical): No    CCM Care Plan  Allergies  Allergen Reactions  . 2,4-D Dimethylamine (Amisol) Swelling  . Ace Inhibitors     Other reaction(s): Angioedema  . Niacin Er   . Penicillins     Medications Reviewed Today    Reviewed by Steele Sizer, MD (Physician) on 03/31/20 at [phone]  Med List Status: <None>  Medication Order Taking? Sig Documenting Provider Last Dose Status Informant  amLODipine (NORVASC) 5 MG tablet [id number] Yes TAKE 1 TABLET(5 MG)  BY MOUTH DAILY Steele Sizer, MD Taking Active   atenolol (TENORMIN) 50 MG tablet [id number] Yes Take 50 mg by mouth daily. [provider] Taking Active   atorvastatin (LIPITOR) 40 MG tablet [id number] Yes TAKE 1 TABLET BY MOUTH EVERY DAY AT 6 PM Sowles, Drue Stager, MD Taking Active   chlorthalidone (HYGROTON) 25 MG tablet [id number] Yes Take 12.5 mg by mouth daily. [provider] Taking Active         Discontinued 03/31/20 [id number] (Change in therapy)   Cholecalciferol (VITAMIN D3) 50 MCG (2000 UT) capsule [id number] Yes TAKE 1 CAPSULE BY MOUTH ONCE DAILY Sowles, Drue Stager, MD Taking Active   donepezil (ARICEPT) 10 MG tablet [id number] Yes TAKE 1 TABLET(10 MG) BY MOUTH DAILY Steele Sizer, MD Taking Active   Mesalamine 800 MG TBEC [id number] Yes Take 2 tablets by mouth 2 (two) times daily. Ezzard Standing, PA-C Taking Active   mirtazapine (REMERON) 15 MG tablet [id number] Yes TAKE 1 TABLET(15 MG) BY MOUTH AT BEDTIME Steele Sizer, MD Taking Active           Patient Active Problem List   Diagnosis Date Noted  . Palpitations 05/21/2019  . Mild malnutrition ([place]) 04/02/2019  . Benign hypertension with CKD (chronic kidney disease) stage III ([place]) 03/13/2018  . Secondary renal hyperparathyroidism ([place])  03/13/2018  . Hyperglycemia 09/07/2016  . Chronic kidney disease (CKD), stage IV (severe) ([place]) 09/05/2016  . Hearing loss 08/25/2014  . 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 Chronic kidney disease, stage III (moderate) ([place]) 08/22/2014  . Crohn's disease of large bowel ([place]) 08/22/2014  . Hereditary essential tremor 08/22/2014  . History of iron deficiency anemia 08/22/2014  . OP (osteoporosis) 08/22/2014  . Dementia, frontotemporal ([place]) 06/28/2009  . Vitamin D deficiency 09/28/2008  . 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 Benign essential HTN 06/15/2006  . Dyslipidemia 06/15/2006    Immunization History  Administered Date(s) Administered  . Fluad Quad(high Dose 65+) 09/17/2018, 10/02/2019  . Influenza, High Dose Seasonal PF 09/05/2016, 09/11/2017  . Influenza,inj,Quad PF,6+ Mos 08/25/2014  . Influenza-Unspecified 02/24/2014  . Moderna Sars-Covid-2 Vaccination 02/10/2019, 03/12/2019  . Pneumococcal Conjugate-13 02/14/2013  . Pneumococcal Polysaccharide-23 10/24/2011  . Tdap 06/20/2010  . Zoster 08/19/2007    Conditions to be addressed/monitored:  Hypertension, Hyperlipidemia, Chronic Kidney Disease, Osteoporosis and Chron's Disease, dementia  There are no care plans that you recently modified to display for this patient.    Medication Assistance: {MEDASSISTANCEINFO:25044}  Patient's preferred pharmacy is:  [facility] ST[facility] #[id number] [place], [place] - [address] AT [facility] [place] [address] Phone: [phone] Fax: [phone]  Uses pill box? {Yes or If no, why not?:20788} Pt endorses ***% compliance  We discussed: {Pharmacy options:24294} Patient decided to: {US Pharmacy Plan:23885}  Care Plan and Follow Up Patient Decision:  {FOLLOWUP:24991}  Plan: {CM FOLLOW UP PLAN:25073}  ***   Current Barriers:  . {pharmacybarriers:24917} . ***  Pharmacist Clinical Goal(s):  Marland Kitchen Patient will {PHARMACYGOALCHOICES:24921} through  collaboration with PharmD and provider.  . ***  Interventions: . 1:1 collaboration with Steele Sizer, MD regarding development and update of comprehensive plan of care as evidenced by provider attestation and co-signature . Inter-disciplinary care team collaboration (see longitudinal plan of care) . Comprehensive medication review performed; medication list updated in electronic medical record  Hypertension (BP goal {CHL HP UPSTREAM Pharmacist BP ranges:[phone]}) -{US controlled/uncontrolled:25276} -Current treatment: . Amlodipine 5 mg daily  . Atenolol 50 mg daily  . Chlorthalidone 25 mg 1/2 tablet daily  -Medications  previously tried: ***  -Current home readings: *** -Current dietary habits: *** -Current exercise habits: *** -{ACTIONS;DENIES/REPORTS:21021675::"Denies"} hypotensive/hypertensive symptoms -Educated on {CCM BP Counseling:25124} -Counseled to monitor BP at home ***, document, and provide log at future appointments -{CCMPHARMDINTERVENTION:25122}  Hyperlipidemia: (LDL goal < ***) -{US controlled/uncontrolled:25276} -Current treatment: . Atorvastatin 40 mg daily  -Medications previously tried: ***  -Current dietary patterns: *** -Current exercise habits: *** -Educated on {CCM HLD Counseling:25126} -{CCMPHARMDINTERVENTION:25122}  Osteoporosis / Osteopenia (Goal ***) -{US controlled/uncontrolled:25276} -Last DEXA Scan: ***   T-Score femoral neck: ***  T-Score total hip: ***  T-Score lumbar spine: ***  T-Score forearm radius: ***  10-year probability of major osteoporotic fracture: ***  10-year probability of hip fracture: *** -Patient {is;is not an osteoporosis candidate:23886} -Current treatment  . Vitamin D3 2000 units daily  -Medications previously tried: ***  -{Osteoporosis Counseling:23892} -{CCMPHARMDINTERVENTION:25122}  *** (Goal: ***) -{US controlled/uncontrolled:25276} -Current treatment  . Donepezil 10 mg daily  . Mirtazapine 15 mg at  bedtime  -Medications previously tried: ***  -{CCMPHARMDINTERVENTION:25122}  *** (Goal: ***) -{US controlled/uncontrolled:25276} -Current treatment  . Mesalamine 800 mg TBEC 800 mg 2 tablets twice daily  -Medications previously tried: ***  -{CCMPHARMDINTERVENTION:25122}    Patient Goals/Self-Care Activities . Patient will:  - {pharmacypatientgoals:24919}  Follow Up Plan: {CM FOLLOW UP MHWK:08811}

## 2020-04-15 ENCOUNTER — Telehealth: Payer: Medicare Other

## 2020-04-30 ENCOUNTER — Telehealth: Payer: Self-pay

## 2020-04-30 NOTE — Progress Notes (Signed)
    Chronic Care Management Pharmacy Assistant   Name: DESHANTI ADCOX  MRN: [id number] DOB: 05-Jan-1917   Reason for Encounter: Medication [facility] visits:  None in previous 6 months  Medications: Outpatient Encounter Medications as of 04/30/2020  Medication Sig  . amLODipine (NORVASC) 5 MG tablet TAKE 1 TABLET(5 MG) BY MOUTH DAILY  . atenolol (TENORMIN) 50 MG tablet Take 50 mg by mouth daily.  Marland Kitchen atorvastatin (LIPITOR) 40 MG tablet TAKE 1 TABLET BY MOUTH EVERY DAY AT 6 PM  . chlorthalidone (HYGROTON) 25 MG tablet Take 12.5 mg by mouth daily.  . Cholecalciferol (VITAMIN D3) 50 MCG (2000 UT) capsule TAKE 1 CAPSULE BY MOUTH ONCE DAILY  . donepezil (ARICEPT) 10 MG tablet TAKE 1 TABLET(10 MG) BY MOUTH DAILY  . Mesalamine 800 MG TBEC Take 2 tablets by mouth 2 (two) times daily.  . mirtazapine (REMERON) 15 MG tablet TAKE 1 TABLET(15 MG) BY MOUTH AT BEDTIME   No facility-administered encounter medications on file as of 04/30/2020.   Performed cost analysis for patient, estimated yearly medication cost around $60.00.  [facility] Pharmacist Assistant [phone]

## 2020-05-05 ENCOUNTER — Telehealth: Payer: Self-pay | Admitting: Family Medicine

## 2020-05-05 NOTE — Telephone Encounter (Addendum)
Daughter would like Dr Ancil Boozer to work pt in  for appt tomorrow, even if the nurse will check here bp.  She states pt is just not herself ,  Does not have the energy she normally hs and sleeping a lot. Please advise

## 2020-05-06 ENCOUNTER — Ambulatory Visit (INDEPENDENT_AMBULATORY_CARE_PROVIDER_SITE_OTHER): Payer: Medicare Other | Admitting: Family Medicine

## 2020-05-06 ENCOUNTER — Encounter: Payer: Self-pay | Admitting: Family Medicine

## 2020-05-06 ENCOUNTER — Other Ambulatory Visit: Payer: Self-pay

## 2020-05-06 ENCOUNTER — Telehealth: Payer: Self-pay

## 2020-05-06 VITALS — BP 136/82 | HR 72 | Temp 97.7°F | Resp 15 | Ht 64.0 in | Wt 103.0 lb

## 2020-05-06 DIAGNOSIS — R5383 Other fatigue: Secondary | ICD-10-CM | POA: Diagnosis not present

## 2020-05-06 DIAGNOSIS — R350 Frequency of micturition: Secondary | ICD-10-CM

## 2020-05-06 DIAGNOSIS — R32 Unspecified urinary incontinence: Secondary | ICD-10-CM

## 2020-05-06 MED ORDER — CIPROFLOXACIN HCL 250 MG PO TABS: 250.0000 mg | ORAL_TABLET | Freq: Two times a day (BID) | ORAL | 0 refills | Status: AC

## 2020-05-06 NOTE — Telephone Encounter (Signed)
Copied from [place] [id number]. Topic: General - Other >> May 06, 2020 11:09 AM Yvette Rack wrote: Reason for CRM: Pt daughter [name] stated she had a missed call from the office so she was returning the call.

## 2020-05-06 NOTE — Progress Notes (Signed)
Name: Meredith Wagner   MRN: [id number]    DOB: March 25, 1917   Date:05/06/2020       Progress Note  Subjective  Chief Complaint  Chief Complaint  Patient presents with  . Excessive Sleep  . "Not feeling well"    HPI  Patient's daughter, [name], gave most of the history. She contacted [place] yesterday because she has noticed that her mother has not been acting her normal self. She does not complain of any pain, however over the past few weeks she started to have increase in fatigue, taking naps, not getting up in am's on her own, and having some urinary incontinence. She cannot make it to the bathroom in time. She does not have fever or chills. She has been less active. Daughter states she got her depends this week to see if she would have less accidents. She is still able to clean herself and walk without assistance. Weight is stable but daughter states she has noticed her mother does not want to swallow pills,  we will start her on Cipro asked pharmacy if it can be crushed, if GFR very low we will back down the dose to once daily. Take her to [facility] if worsening of symptoms    Patient Active Problem List   Diagnosis Date Noted  . Palpitations 05/21/2019  . Mild malnutrition ([place]) 04/02/2019  . Benign hypertension with CKD (chronic kidney disease) stage III ([place]) 03/13/2018  . Secondary renal hyperparathyroidism ([place]) 03/13/2018  . Hyperglycemia 09/07/2016  . Chronic kidney disease (CKD), stage IV (severe) ([place]) 09/05/2016  . Hearing loss 08/25/2014  . 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 Chronic kidney disease, stage III (moderate) ([place]) 08/22/2014  . Crohn's disease of large bowel ([place]) 08/22/2014  . Hereditary essential tremor 08/22/2014  . History of iron deficiency anemia 08/22/2014  . OP (osteoporosis) 08/22/2014  . Dementia, frontotemporal ([place]) 06/28/2009  . Vitamin D deficiency 09/28/2008  . 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 Benign essential HTN 06/15/2006  . Dyslipidemia  06/15/2006    Past Surgical History:  Procedure Laterality Date  . ABDOMINAL HYSTERECTOMY  09/07/2009    No family history on file.  Social History   Tobacco Use  . Smoking status: Never Smoker  . Smokeless tobacco: Never Used  . Tobacco comment: smoking cessation materials not required  Substance Use Topics  . Alcohol use: No    Alcohol/week: 0.0 standard drinks     Current Outpatient Medications:  .  amLODipine (NORVASC) 5 MG tablet, TAKE 1 TABLET(5 MG) BY MOUTH DAILY, Disp: 90 tablet, Rfl: 1 .  atenolol (TENORMIN) 50 MG tablet, Take 50 mg by mouth daily., Disp: , Rfl:  .  atorvastatin (LIPITOR) 40 MG tablet, TAKE 1 TABLET BY MOUTH EVERY DAY AT 6 PM, Disp: 90 tablet, Rfl: 1 .  chlorthalidone (HYGROTON) 25 MG tablet, Take 12.5 mg by mouth daily., Disp: , Rfl:  .  Cholecalciferol (VITAMIN D3) 50 MCG (2000 UT) capsule, TAKE 1 CAPSULE BY MOUTH ONCE DAILY, Disp: 30 capsule, Rfl: 12 .  donepezil (ARICEPT) 10 MG tablet, TAKE 1 TABLET(10 MG) BY MOUTH DAILY, Disp: 90 tablet, Rfl: 0 .  Mesalamine 800 MG TBEC, Take 2 tablets by mouth 2 (two) times daily., Disp: , Rfl:  .  mirtazapine (REMERON) 15 MG tablet, TAKE 1 TABLET(15 MG) BY MOUTH AT BEDTIME, Disp: 90 tablet, Rfl: 1  Allergies  Allergen Reactions  . 2,4-D Dimethylamine (Amisol) Swelling  . Ace Inhibitors     Other reaction(s): Angioedema  . Niacin  Er   . Penicillins     I personally reviewed active problem list, medication list, allergies, family history, social history, health maintenance with the patient/caregiver today.   ROS  Ten systems reviewed and is negative except as mentioned in HPI   Objective  Vitals:   05/06/20 0734  BP: 136/82  Pulse: 72  Resp: 15  Temp: 97.7 F (36.5 C)  TempSrc: Oral  Weight: 103 lb (46.7 kg)  Height: 5' 4"  (1.626 m)    Body mass index is 17.68 kg/m.  Physical Exam  Constitutional: Patient appears frail but in no distress.  HEENT: head atraumatic, normocephalic, pupils  equal and reactive to light,neck supple poor dentition Cardiovascular: Normal rate, regular rhythm and normal heart sounds.  No murmur heard. No BLE edema. Pulmonary/Chest: Effort normal and breath sounds normal. No respiratory distress. Abdominal: Soft.  There is no tenderness. Negative CVA tenderness  Psychiatric: Cooperative, smiling, " I feel fine"  PHQ2/9: Depression screen [facility] 2/9 05/06/2020 03/31/2020 10/02/2019 09/25/2019 04/02/2019  Decreased Interest 2 0 0 0 0  Down, Depressed, Hopeless 2 0 0 0 0  PHQ - 2 Score 4 0 0 0 0  Altered sleeping 0 - - - 0  Tired, decreased energy 0 - - - 0  Change in appetite 2 - - - 0  Feeling bad or failure about yourself  0 - - - 0  Trouble concentrating 0 - - - 0  Moving slowly or fidgety/restless 0 - - - 0  Suicidal thoughts 0 - - - 0  PHQ-9 Score 6 - - - 0  Difficult doing work/chores - - - - Not difficult at all    phq 9 is positive - answered by daughter    Fall Risk: Fall Risk  05/06/2020 03/31/2020 09/25/2019 04/02/2019 09/17/2018  Falls in the past year? 0 0 0 0 0  Number falls in past yr: 0 0 0 0 0  Injury with Fall? 0 - 0 0 0  Risk for fall due to : - - No Fall Risks - -  Risk for fall due to: Comment - - - - -  Follow up - - Falls prevention discussed - -    Functional Status Survey: Is the patient deaf or have difficulty hearing?: Yes Does the patient have difficulty seeing, even when wearing glasses/contacts?: No Does the patient have difficulty concentrating, remembering, or making decisions?: Yes Does the patient have difficulty walking or climbing stairs?: No Does the patient have difficulty dressing or bathing?: No Does the patient have difficulty doing errands alone such as visiting a doctor's office or shopping?: No    Assessment & Plan  1. Urinary incontinence, unspecified type  - CBC with Differential/Platelet - COMPLETE METABOLIC PANEL WITH GFR - CULTURE, URINE COMPREHENSIVE - Urinalysis, Complete - ciprofloxacin  (CIPRO) 250 MG tablet; Take 1 tablet (250 mg total) by mouth 2 (two) times daily.  Dispense: 10 tablet; Refill: 0  2. Fatigue, unspecified type  - CBC with Differential/Platelet - COMPLETE METABOLIC PANEL WITH GFR - CULTURE, URINE COMPREHENSIVE - Urinalysis, Complete - ciprofloxacin (CIPRO) 250 MG tablet; Take 1 tablet (250 mg total) by mouth 2 (two) times daily.  Dispense: 10 tablet; Refill: 0  3. Urinary frequency  - ciprofloxacin (CIPRO) 250 MG tablet; Take 1 tablet (250 mg total) by mouth 2 (two) times daily.  Dispense: 10 tablet; Refill: 0

## 2020-05-07 LAB — COMPLETE METABOLIC PANEL WITH GFR
AG Ratio: 1.3 (calc) (ref 1.0–2.5)
ALT: 12 U/L (ref 6–29)
AST: 19 U/L (ref 10–35)
Albumin: 4.3 g/dL (ref 3.6–5.1)
Alkaline phosphatase (APISO): 75 U/L (ref 37–153)
BUN/Creatinine Ratio: 21 (calc) (ref 6–22)
BUN: 27 mg/dL — ABNORMAL HIGH (ref 7–25)
CO2: 27 mmol/L (ref 20–32)
Calcium: 10.4 mg/dL (ref 8.6–10.4)
Chloride: 98 mmol/L (ref 98–110)
Creat: 1.29 mg/dL — ABNORMAL HIGH (ref 0.60–0.88)
GFR, Est African American: 39 mL/min/{1.73_m2} — ABNORMAL LOW (ref >=60)
GFR, Est Non African American: 34 mL/min/{1.73_m2} — ABNORMAL LOW (ref >=60)
Globulin: 3.3 g/dL (calc) (ref 1.9–3.7)
Glucose, Bld: 115 mg/dL — ABNORMAL HIGH (ref 65–99)
Potassium: 3.4 mmol/L — ABNORMAL LOW (ref 3.5–5.3)
Sodium: 138 mmol/L (ref 135–146)
Total Bilirubin: 0.6 mg/dL (ref 0.2–1.2)
Total Protein: 7.6 g/dL (ref 6.1–8.1)

## 2020-05-07 LAB — CBC WITH DIFFERENTIAL/PLATELET
Absolute Monocytes: 1364 cells/uL — ABNORMAL HIGH (ref 200–950)
Basophils Absolute: 27 cells/uL (ref 0–200)
Basophils Relative: 0.2 %
Eosinophils Absolute: 14 cells/uL — ABNORMAL LOW (ref 15–500)
Eosinophils Relative: 0.1 %
HCT: 34.4 % — ABNORMAL LOW (ref 35.0–45.0)
Hemoglobin: 11.1 g/dL — ABNORMAL LOW (ref 11.7–15.5)
Lymphs Abs: 1472 cells/uL (ref 850–3900)
MCH: 29.8 pg (ref 27.0–33.0)
MCHC: 32.3 g/dL (ref 32.0–36.0)
MCV: 92.5 fL (ref 80.0–100.0)
MPV: 11.2 fL (ref 7.5–12.5)
Monocytes Relative: 10.1 %
Neutro Abs: 10625 cells/uL — ABNORMAL HIGH (ref 1500–7800)
Neutrophils Relative %: 78.7 %
Platelets: 266 10*3/uL (ref 140–400)
RBC: 3.72 10*6/uL — ABNORMAL LOW (ref 3.80–5.10)
RDW: 12.8 % (ref 11.0–15.0)
Total Lymphocyte: 10.9 %
WBC: 13.5 10*3/uL — ABNORMAL HIGH (ref 3.8–10.8)

## 2020-05-08 ENCOUNTER — Other Ambulatory Visit: Payer: Self-pay

## 2020-05-08 ENCOUNTER — Encounter: Payer: Self-pay | Admitting: Emergency Medicine

## 2020-05-08 ENCOUNTER — Emergency Department: Payer: Medicare Other

## 2020-05-08 ENCOUNTER — Emergency Department
Admission: EM | Admit: 2020-05-08 | Discharge: 2020-05-08 | Disposition: A | Discharge status: Home / Self Care | Payer: Medicare Other | Attending: Emergency Medicine | Admitting: Emergency Medicine

## 2020-05-08 DIAGNOSIS — E860 Dehydration: Secondary | ICD-10-CM | POA: Insufficient documentation

## 2020-05-08 DIAGNOSIS — F0390 Unspecified dementia without behavioral disturbance: Secondary | ICD-10-CM | POA: Diagnosis not present

## 2020-05-08 DIAGNOSIS — N184 Chronic kidney disease, stage 4 (severe): Secondary | ICD-10-CM | POA: Diagnosis not present

## 2020-05-08 DIAGNOSIS — I129 Hypertensive chronic kidney disease with stage 1 through stage 4 chronic kidney disease, or unspecified chronic kidney disease: Secondary | ICD-10-CM | POA: Diagnosis not present

## 2020-05-08 DIAGNOSIS — Z20822 Contact with and (suspected) exposure to covid-19: Secondary | ICD-10-CM | POA: Diagnosis not present

## 2020-05-08 DIAGNOSIS — R400 Somnolence: Secondary | ICD-10-CM | POA: Diagnosis not present

## 2020-05-08 DIAGNOSIS — R531 Weakness: Secondary | ICD-10-CM | POA: Diagnosis present

## 2020-05-08 DIAGNOSIS — D631 Anemia in chronic kidney disease: Secondary | ICD-10-CM | POA: Insufficient documentation

## 2020-05-08 DIAGNOSIS — Z79899 Other long term (current) drug therapy: Secondary | ICD-10-CM | POA: Insufficient documentation

## 2020-05-08 DIAGNOSIS — R410 Disorientation, unspecified: Secondary | ICD-10-CM | POA: Insufficient documentation

## 2020-05-08 LAB — COMPREHENSIVE METABOLIC PANEL
ALT: 12 U/L (ref 0–44)
AST: 22 U/L (ref 15–41)
Albumin: 3.7 g/dL (ref 3.5–5.0)
Alkaline Phosphatase: 63 U/L (ref 38–126)
Anion gap: 15 (ref 5–15)
BUN: 34 mg/dL — ABNORMAL HIGH (ref 8–23)
CO2: 23 mmol/L (ref 22–32)
Calcium: 9.4 mg/dL (ref 8.9–10.3)
Chloride: 98 mmol/L (ref 98–111)
Creatinine, Ser: 1.23 mg/dL — ABNORMAL HIGH (ref 0.44–1.00)
GFR, Estimated: 39 mL/min — ABNORMAL LOW (ref >60)
Glucose, Bld: 88 mg/dL (ref 70–99)
Potassium: 2.8 mmol/L — ABNORMAL LOW (ref 3.5–5.1)
Sodium: 136 mmol/L (ref 135–145)
Total Bilirubin: 1.3 mg/dL — ABNORMAL HIGH (ref 0.3–1.2)
Total Protein: 7.6 g/dL (ref 6.5–8.1)

## 2020-05-08 LAB — CBC WITH DIFFERENTIAL/PLATELET
Abs Immature Granulocytes: 0.03 10*3/uL (ref 0.00–0.07)
Basophils Absolute: 0 10*3/uL (ref 0.0–0.1)
Basophils Relative: 0 %
Eosinophils Absolute: 0 10*3/uL (ref 0.0–0.5)
Eosinophils Relative: 0 %
HCT: 36.3 % (ref 36.0–46.0)
Hemoglobin: 11.2 g/dL — ABNORMAL LOW (ref 12.0–15.0)
Immature Granulocytes: 0 %
Lymphocytes Relative: 16 %
Lymphs Abs: 1.9 10*3/uL (ref 0.7–4.0)
MCH: 30.5 pg (ref 26.0–34.0)
MCHC: 30.9 g/dL (ref 30.0–36.0)
MCV: 98.9 fL (ref 80.0–100.0)
Monocytes Absolute: 1.1 10*3/uL — ABNORMAL HIGH (ref 0.1–1.0)
Monocytes Relative: 10 %
Neutro Abs: 8.7 10*3/uL — ABNORMAL HIGH (ref 1.7–7.7)
Neutrophils Relative %: 74 %
Platelets: 234 10*3/uL (ref 150–400)
RBC: 3.67 MIL/uL — ABNORMAL LOW (ref 3.87–5.11)
RDW: 13.4 % (ref 11.5–15.5)
WBC: 11.9 10*3/uL — ABNORMAL HIGH (ref 4.0–10.5)
nRBC: 0 % (ref 0.0–0.2)

## 2020-05-08 LAB — URINALYSIS, COMPLETE (UACMP) WITH MICROSCOPIC
Bilirubin Urine: NEGATIVE
Glucose, UA: NEGATIVE mg/dL
Hgb urine dipstick: NEGATIVE
Ketones, ur: 5 mg/dL — AB
Leukocytes,Ua: NEGATIVE
Nitrite: NEGATIVE
Protein, ur: NEGATIVE mg/dL
Specific Gravity, Urine: 1.021 (ref 1.005–1.030)
pH: 5 (ref 5.0–8.0)

## 2020-05-08 LAB — LIPASE, BLOOD: Lipase: 44 U/L (ref 11–51)

## 2020-05-08 LAB — RESP PANEL BY RT-PCR (FLU A&B, COVID) ARPGX2
Influenza A by PCR: NEGATIVE
Influenza B by PCR: NEGATIVE
SARS Coronavirus 2 by RT PCR: NEGATIVE

## 2020-05-08 MED ORDER — SODIUM CHLORIDE 0.9 % IV BOLUS
1000.0000 mL | Freq: Once | INTRAVENOUS | Status: AC
  Administered 2020-05-08: 1000 mL via INTRAVENOUS

## 2020-05-08 MED ORDER — POTASSIUM CHLORIDE ER 10 MEQ PO TBCR: 10.0000 meq | EXTENDED_RELEASE_TABLET | Freq: Two times a day (BID) | ORAL | 0 refills | Status: AC

## 2020-05-08 MED ORDER — POTASSIUM CHLORIDE 20 MEQ PO PACK
60.0000 meq | PACK | Freq: Once | ORAL | Status: AC
  Administered 2020-05-08: 60 meq via ORAL
  Filled 2020-05-08: qty 3

## 2020-05-08 MED ORDER — ONDANSETRON HCL 4 MG/2ML IJ SOLN
4.0000 mg | Freq: Once | INTRAMUSCULAR | Status: AC
  Administered 2020-05-08: 4 mg via INTRAVENOUS
  Filled 2020-05-08: qty 2

## 2020-05-08 NOTE — ED Notes (Signed)
Pt daughter verbalized understanding of d/c instructions at this time. Pt/daughter given opportunity to ask questions at this time. Prescriptions reviewed at this time.   Pt assisted to ED lobby via wheelchair, NAD noted, RR even and unlabored.

## 2020-05-08 NOTE — ED Provider Notes (Signed)
[facility] Emergency Department Provider Note  ____________________________________________  Time seen: Approximately 8:06 AM  I have reviewed the triage vital signs and the nursing notes.   HISTORY  Chief Complaint Weakness    Level 5 Caveat: Portions of the History and Physical including HPI and review of systems are unable to be completely obtained due to patient being a poor historian   HPI Meredith Wagner is a 87 y.o. female with a history of Crohn's, dementia, hypertension who comes to the ED due to generalized weakness, increased sleeping, decreased oral intake for the past 4 days.  No focal pain or fever.  No vomiting.  Saw PCP, was started on antibiotics for presumed UTI which she has taken 2 doses of yesterday.   She is continue taking her medications but last dose of usual home medications was yesterday morning.     Past Medical History:  Diagnosis Date  . ACE inhibitor-aggravated angioedema   . Anemia   . Appetite lost   . Crohn's disease ([place])   . Familial tremor   . Frontotemporal dementia ([place])   . Hyperlipidemia   . Hypertension   . Hypokalemia   . Low kidney function   . Osteoporosis   . Vitamin D deficiency      Patient Active Problem List   Diagnosis Date Noted  . Palpitations 05/21/2019  . Mild malnutrition ([place]) 04/02/2019  . Benign hypertension with CKD (chronic kidney disease) stage III ([place]) 03/13/2018  . Secondary renal hyperparathyroidism ([place]) 03/13/2018  . Hyperglycemia 09/07/2016  . Chronic kidney disease (CKD), stage IV (severe) ([place]) 09/05/2016  . Hearing loss 08/25/2014  . 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 Chronic kidney disease, stage III (moderate) ([place]) 08/22/2014  . Crohn's disease of large bowel ([place]) 08/22/2014  . Hereditary essential tremor 08/22/2014  . History of iron deficiency anemia 08/22/2014  . OP (osteoporosis) 08/22/2014  . Dementia, frontotemporal ([place]) 06/28/2009  .  Vitamin D deficiency 09/28/2008  . 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 Benign essential HTN 06/15/2006  . Dyslipidemia 06/15/2006     Past Surgical History:  Procedure Laterality Date  . ABDOMINAL HYSTERECTOMY  09/07/2009     Prior to Admission medications   Medication Sig Start Date End Date Taking? Authorizing Provider  potassium chloride (KLOR-CON) 10 MEQ tablet Take 1 tablet (10 mEq total) by mouth 2 (two) times daily for 7 days. 05/08/20 05/15/20 Yes Carrie Mew, MD  amLODipine (NORVASC) 5 MG tablet TAKE 1 TABLET(5 MG) BY MOUTH DAILY 10/20/19   Steele Sizer, MD  atenolol (TENORMIN) 50 MG tablet Take 50 mg by mouth daily. 08/01/19   [provider]  atorvastatin (LIPITOR) 40 MG tablet TAKE 1 TABLET BY MOUTH EVERY DAY AT 6 PM 11/26/19   Steele Sizer, MD  chlorthalidone (HYGROTON) 25 MG tablet Take 12.5 mg by mouth daily. 08/11/19   [provider]  Cholecalciferol (VITAMIN D3) 50 MCG (2000 UT) capsule TAKE 1 CAPSULE BY MOUTH ONCE DAILY 07/07/19   Steele Sizer, MD  ciprofloxacin (CIPRO) 250 MG tablet Take 1 tablet (250 mg total) by mouth 2 (two) times daily. 05/06/20   Steele Sizer, MD  donepezil (ARICEPT) 10 MG tablet TAKE 1 TABLET(10 MG) BY MOUTH DAILY 03/25/20   Steele Sizer, MD  Mesalamine 800 MG TBEC Take 2 tablets by mouth 2 (two) times daily. 09/07/15   Laurine Blazer B, PA-C  mirtazapine (REMERON) 15 MG tablet TAKE 1 TABLET(15 MG) BY MOUTH AT BEDTIME 03/15/20   Steele Sizer, MD  Allergies 2,4-d dimethylamine (amisol); Ace inhibitors; Niacin er; and Penicillins   No family history on file.  Social History Social History   Tobacco Use  . Smoking status: Never Smoker  . Smokeless tobacco: Never Used  . Tobacco comment: smoking cessation materials not required  Vaping Use  . Vaping Use: Never used  Substance Use Topics  . Alcohol use: No    Alcohol/week: 0.0 standard drinks  . Drug use: No    Review of Systems Level 5 Caveat: Portions of  the History and Physical including HPI and review of systems are unable to be completely obtained due to patient being a poor historian   Constitutional:   No known fever.  ENT:   No rhinorrhea. Cardiovascular:   No chest pain or syncope. Respiratory:   No dyspnea or cough. Gastrointestinal:   Negative for abdominal pain, vomiting and diarrhea.  Musculoskeletal:   Negative for focal pain or swelling ____________________________________________   PHYSICAL EXAM:  VITAL SIGNS: ED Triage Vitals  Enc Vitals Group     BP 05/08/20 0735 (!) 166/118     Pulse Rate 05/08/20 0738 (!) 55     Resp 05/08/20 0735 15     Temp 05/08/20 0738 97.7 F (36.5 C)     Temp Source 05/08/20 0738 Oral     SpO2 05/08/20 0738 95 %     Weight --      Height --      Head Circumference --      Peak Flow --      Pain Score --      Pain Loc --      Pain Edu? --      Excl. in [place]? --     Vital signs reviewed, nursing assessments reviewed.   Constitutional:   Alert, not oriented. Non-toxic appearance. Eyes:   Conjunctivae are normal. EOMI. PERRL. ENT      Head:   Normocephalic and atraumatic.      Nose:   No congestion/rhinnorhea.       Mouth/Throat:   Dry mucous membranes, no pharyngeal erythema. No peritonsillar mass.       Neck:   No meningismus. Full ROM. Hematological/Lymphatic/Immunilogical:   No cervical lymphadenopathy. Cardiovascular:   Bradycardia heart rate 55. Symmetric bilateral radial and DP pulses.  No murmurs. Cap refill less than 2 seconds. Respiratory:   Normal respiratory effort without tachypnea/retractions. Breath sounds are clear and equal bilaterally. No wheezes/rales/rhonchi. Gastrointestinal:   Soft and nontender. Non distended. There is no CVA tenderness.  No rebound, rigidity, or guarding. Genitourinary:   deferred Musculoskeletal:   Normal range of motion in all extremities. No joint effusions.  No lower extremity tenderness.  No edema. Neurologic:   Normal speech.   Confused. Motor grossly intact. No acute focal neurologic deficits are appreciated.  Skin:    Skin is warm, dry and intact. No rash noted.  No petechiae, purpura, or bullae.  ____________________________________________    LABS (pertinent positives/negatives) (all labs ordered are listed, but only abnormal results are displayed) Labs Reviewed  COMPREHENSIVE METABOLIC PANEL - Abnormal; Notable for the following components:      Result Value   Potassium 2.8 (*)    BUN 34 (*)    Creatinine, Ser 1.23 (*)    Total Bilirubin 1.3 (*)    GFR, Estimated 39 (*)    All other components within normal limits  CBC WITH DIFFERENTIAL/PLATELET - Abnormal; Notable for the following components:   WBC 11.9 (*)  RBC 3.67 (*)    Hemoglobin 11.2 (*)    Neutro Abs 8.7 (*)    Monocytes Absolute 1.1 (*)    All other components within normal limits  URINALYSIS, COMPLETE (UACMP) WITH MICROSCOPIC - Abnormal; Notable for the following components:   Color, Urine YELLOW (*)    APPearance HAZY (*)    Ketones, ur 5 (*)    Bacteria, UA RARE (*)    All other components within normal limits  RESP PANEL BY RT-PCR (FLU A&B, COVID) [id number]  URINE CULTURE  LIPASE, BLOOD   ____________________________________________   EKG  Interpreted by me Sinus bradycardia rate of 51.  Normal axis and intervals.  Right bundle branch block.  Normal ST segments and T waves.  No ischemic changes.  ____________________________________________    [id number]  DG Chest Portable 1 View  Result Date: 05/08/2020 CLINICAL DATA:  Pt arrived via [facility] from home with c/o weakness and decreased PO intake per family. Per EMS, pt stated she felt fine and did not want to come into ED today, pt seen Wednesday for possible UTI, still waiting for results, history of hypertension, hypokalemia, Crohn's Disease, anemia, ACE inhibitor, nonsmoker EXAM: PORTABLE CHEST 1 VIEW COMPARISON:  05/27/2009. FINDINGS: Cardiac silhouette is normal in size.  No mediastinal or hilar masses. Opacity at the left lung base partly silhouettes the hemidiaphragm consistent with atelectasis possibly with a small effusion. Mild linear opacities are noted at the right lung base, stable consistent with scarring. Remainder of the lungs is clear. No pneumothorax. Skeletal structures are grossly intact. IMPRESSION: 1. No acute cardiopulmonary disease. Electronically Signed   By: Lajean Manes M.D.   On: 05/08/2020 08:00    ____________________________________________   PROCEDURES Procedures  ____________________________________________  DIFFERENTIAL DIAGNOSIS   Dehydration, electrolyte abnormality, UTI, pneumonia, COVID, flu, progressive dementia, anemia  CLINICAL IMPRESSION / ASSESSMENT AND PLAN / ED COURSE  Medications ordered in the ED: Medications  sodium chloride 0.9 % bolus 1,000 mL (1,000 mLs Intravenous New Bag/Given 05/08/20 0825)  ondansetron (ZOFRAN) injection 4 mg (4 mg Intravenous Given 05/08/20 0829)  potassium chloride (KLOR-CON) packet 60 mEq (60 mEq Oral Given 05/08/20 0947)    Pertinent labs & imaging results that were available during my care of the patient were reviewed by me and considered in my medical decision making (see chart for details).   TALICIA SUI was evaluated in Emergency Department on 05/08/2020 for the symptoms described in the history of present illness. She was evaluated in the context of the global COVID-19 pandemic, which necessitated consideration that the patient might be at risk for infection with the SARS-CoV-2 virus that causes COVID-19. Institutional protocols and algorithms that pertain to the evaluation of patients at risk for COVID-19 are in a state of rapid change based on information released by regulatory bodies including the CDC and federal and state organizations. These policies and algorithms were followed during the patient's care in the ED.   Patient with advanced dementia presents with generalized  weakness and decreased oral intake.  Will obtain screening work-up with labs, urinalysis, chest x-ray, COVID swab.  No evidence of head trauma, no symptoms to suggest ICH or stroke.   ----------------------------------------- 12:08 PM on 05/08/2020 -----------------------------------------  Labs unremarkable except for hypokalemia of 2.8.  Patient given oral potassium replacement, tolerating p.o.  Stable for discharge home to follow-up with PCP, will finish her course of antibiotics that PCP started her on and follow-up.     ____________________________________________   FINAL CLINICAL IMPRESSION(S) /  ED DIAGNOSES    Final diagnoses:  Weakness  Dehydration     ED Discharge Orders         Ordered    potassium chloride (KLOR-CON) 10 MEQ tablet  2 times daily        05/08/20 1207          Portions of this note were generated with dragon dictation software. Dictation errors may occur despite best attempts at proofreading.   Carrie Mew, MD 05/08/20 1208

## 2020-05-08 NOTE — ED Notes (Signed)
Pt tolerating PO intake well at this time, no c/o nausea or vomiting at this time.

## 2020-05-08 NOTE — ED Triage Notes (Signed)
Pt arrived via [facility] from home with c/o weakness and decreased PO intake per family. Per EMS, pt stated she felt fine and did not want to come into ED today.   Per EMS, pt seen Wednesday for possible UTI, still waiting for results.   Per EMS, pt up with assistance to EMS stretcher. Per EMS, pt A&O to baseline per family

## 2020-05-09 LAB — URINALYSIS, COMPLETE
Bilirubin Urine: NEGATIVE
Glucose, UA: NEGATIVE
Hgb urine dipstick: NEGATIVE
Leukocytes,Ua: NEGATIVE
Nitrite: NEGATIVE
RBC / HPF: NONE SEEN /HPF (ref 0–2)
Specific Gravity, Urine: 1.02 (ref 1.001–1.035)
pH: <=5 (ref 5.0–8.0)

## 2020-05-09 LAB — URINE CULTURE: Culture: NO GROWTH

## 2020-05-09 LAB — CULTURE, URINE COMPREHENSIVE
MICRO NUMBER:: 11856328
SPECIMEN QUALITY:: ADEQUATE

## 2020-05-13 ENCOUNTER — Telehealth: Payer: Self-pay

## 2020-05-13 ENCOUNTER — Telehealth: Payer: Self-pay | Admitting: Family Medicine

## 2020-05-13 ENCOUNTER — Other Ambulatory Visit: Payer: Self-pay

## 2020-05-13 DIAGNOSIS — Z515 Encounter for palliative care: Secondary | ICD-10-CM

## 2020-05-13 NOTE — Telephone Encounter (Signed)
Alwyn Ren from [facility] called to see if Dr. Ancil Boozer would be the attending physician while pt is in [facility] /please advise and Ask for Tammy Reaves

## 2020-05-13 NOTE — Telephone Encounter (Signed)
Patient's daughter Meredith Wagner called to notify Dr. Ancil Boozer of patient's declining health. She stated Ms Both has been complaining of severe pain with movement/positioning assistance and has become totally dependant as far as care. She would like to request Hospice home health assistance at this time. Referral placed and Hospice contacted. Sent by fax information requested by Tammy. Meredith Wagner was called and updated on status.

## 2020-05-14 NOTE — Telephone Encounter (Signed)
Order was faxed over to [facility] on 05/13/2020 and on the order Dr. Ancil Boozer stated that she would be the attending physician.

## 2020-05-28 ENCOUNTER — Other Ambulatory Visit: Payer: Self-pay | Admitting: Family Medicine

## 2020-05-28 DIAGNOSIS — E785 Hyperlipidemia, unspecified: Secondary | ICD-10-CM

## 2020-06-02 DEATH — deceased

## 2020-10-01 ENCOUNTER — Ambulatory Visit: Payer: Medicare Other | Admitting: Family Medicine

## 2022-01-24 IMAGING — DX DG CHEST 1V PORT
1 series · 1 of 1 positions shown · non-contrast
Comparison: 05/27/2009.

CLINICAL DATA: Pt arrived via [facility] from home with c/o weakness and
decreased PO intake per family. Per EMS, pt stated she felt fine and
did not want to come into ED today, pt seen [REDACTED] for possible
UTI, still waiting for results, history of hypertension,
hypokalemia, Crohn's Disease, anemia, ACE inhibitor, nonsmoker

EXAM:
PORTABLE CHEST 1 VIEW

[chest ap]
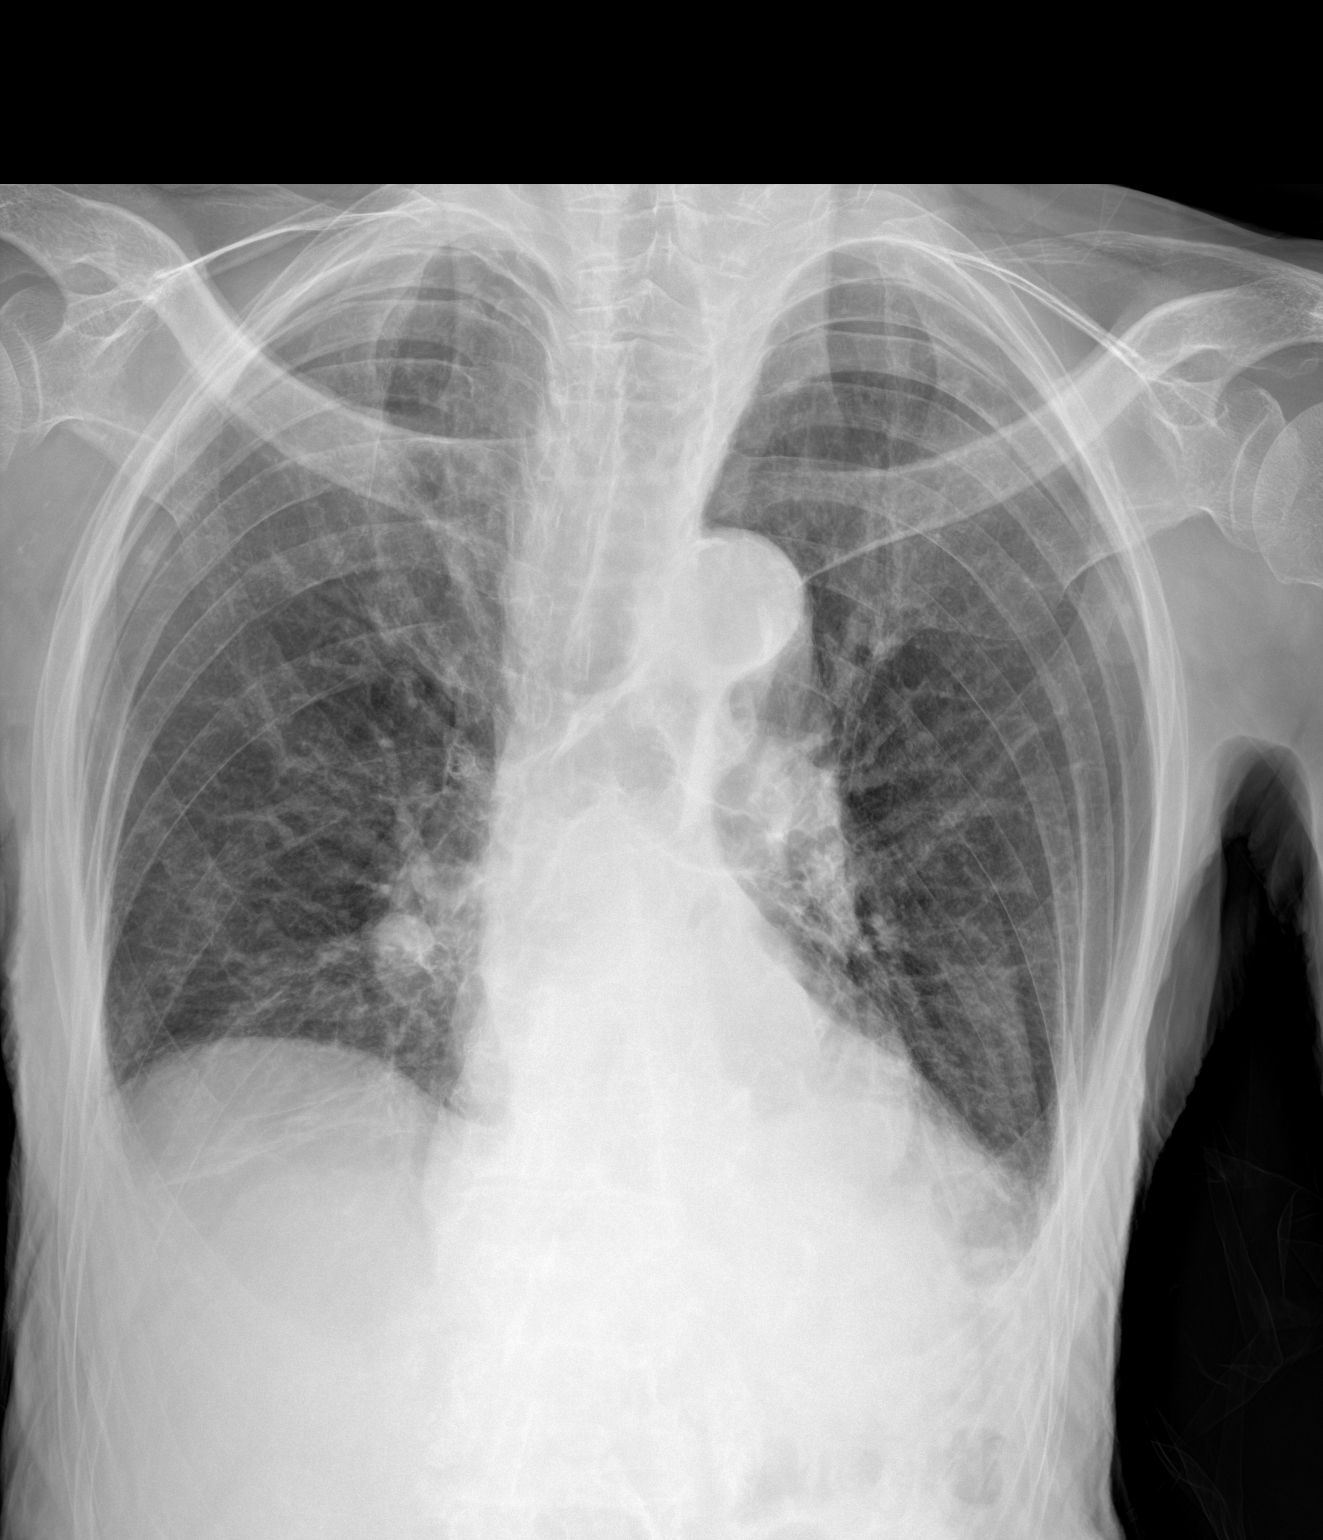

[1 of 1 positions shown; findings below may reference images not displayed]

FINDINGS: Cardiac silhouette is normal in size. No mediastinal or hilar
masses.

Opacity at the left lung base partly silhouettes the hemidiaphragm
consistent with atelectasis possibly with a small effusion. Mild
linear opacities are noted at the right lung base, stable consistent
with scarring. Remainder of the lungs is clear.

No pneumothorax.

Skeletal structures are grossly intact.
IMPRESSION: 1. No acute cardiopulmonary disease.
